# Patient Record
Sex: Female | Born: 1995 | Hispanic: No | Marital: Single | Smoking: Never smoker
Health system: Southern US, Community
[De-identification: ages and names within clinical notes are randomized; demographics above are authoritative.]

## PROBLEM LIST (undated history)

## (undated) ENCOUNTER — Inpatient Hospital Stay (HOSPITAL_COMMUNITY): Payer: Self-pay

## (undated) DIAGNOSIS — Z8619 Personal history of other infectious and parasitic diseases: Secondary | ICD-10-CM

## (undated) DIAGNOSIS — N390 Urinary tract infection, site not specified: Secondary | ICD-10-CM

## (undated) DIAGNOSIS — D649 Anemia, unspecified: Secondary | ICD-10-CM

## (undated) DIAGNOSIS — R7989 Other specified abnormal findings of blood chemistry: Secondary | ICD-10-CM

## (undated) DIAGNOSIS — E039 Hypothyroidism, unspecified: Secondary | ICD-10-CM

## (undated) DIAGNOSIS — O139 Gestational [pregnancy-induced] hypertension without significant proteinuria, unspecified trimester: Secondary | ICD-10-CM

## (undated) DIAGNOSIS — L309 Dermatitis, unspecified: Secondary | ICD-10-CM

## (undated) HISTORY — PX: NO PAST SURGERIES: SHX2092

## (undated) HISTORY — DX: Hypothyroidism, unspecified: E03.9

## (undated) HISTORY — DX: Other specified abnormal findings of blood chemistry: R79.89

## (undated) HISTORY — DX: Personal history of other infectious and parasitic diseases: Z86.19

## (undated) HISTORY — DX: Anemia, unspecified: D64.9

---
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
## 2016-11-04 DIAGNOSIS — R7989 Other specified abnormal findings of blood chemistry: Secondary | ICD-10-CM

## 2016-11-04 HISTORY — DX: Other specified abnormal findings of blood chemistry: R79.89

## 2016-12-11 ENCOUNTER — Encounter: Payer: Self-pay | Admitting: Family Medicine

## 2017-11-04 DIAGNOSIS — Z5189 Encounter for other specified aftercare: Secondary | ICD-10-CM

## 2017-11-04 HISTORY — DX: Encounter for other specified aftercare: Z51.89

## 2018-07-02 DIAGNOSIS — L83 Acanthosis nigricans: Secondary | ICD-10-CM | POA: Diagnosis not present

## 2018-07-02 DIAGNOSIS — L299 Pruritus, unspecified: Secondary | ICD-10-CM | POA: Diagnosis not present

## 2018-09-04 ENCOUNTER — Ambulatory Visit: Payer: BLUE CROSS/BLUE SHIELD | Admitting: Internal Medicine

## 2018-09-04 ENCOUNTER — Encounter: Payer: Self-pay | Admitting: Internal Medicine

## 2018-09-04 VITALS — BP 118/72 | HR 70 | Ht 62.5 in | Wt 172.6 lb

## 2018-09-04 DIAGNOSIS — E039 Hypothyroidism, unspecified: Secondary | ICD-10-CM | POA: Diagnosis not present

## 2018-09-04 LAB — TSH: TSH: 4.95 u[IU]/mL — ABNORMAL HIGH (ref 0.35–4.50)

## 2018-09-04 LAB — T4, FREE: Free T4: 0.74 ng/dL (ref 0.60–1.60)

## 2018-09-04 NOTE — Progress Notes (Signed)
Name: Joann Fleming  MRN/ DOB: [id number], Nov 11, 1995    Age/ Sex: 22 y.o., female    PCP: Barry Brunner   Reason for Endocrinology Evaluation: Hypothyroidism/ elevated prolactin     Date of Initial Endocrinology Evaluation: 09/04/2018     HPI: Joann Fleming is a 22 y.o. female with unremarkable past medical history . The patient presented for initial endocrinology clinic visit on 09/04/2018 for consultative assistance with her Hypothyroidism   In 2018, during work up for severe headaches, she was found to have elevated TSH of 454 uIU/mL . At the time she was euthyroid without any c/o of weight gain, fatigue, constipation or dry skin. Her menses have always been regular.  Denies neck swelling, pain, dysphagia or hoarseness of the voice.  Menarche age 2 yrs.   Denies OTC meds at this time including biotin or any other vitamins.  Mother with DM.     I have reviewed PMH, PSH, SH and FH   HISTORY:  Past Medical History:  Past Medical History:  Diagnosis Date  . Abnormal TSH 2018    Past Surgical History: None   Social History:  reports that she has never smoked. She has never used smokeless tobacco. She reports that she does not use drugs.  Family History: family history includes Diabetes type II in her mother.   HOME MEDICATIONS: No current outpatient medications on file prior to visit.   No current facility-administered medications on file prior to visit.       REVIEW OF SYSTEMS: A comprehensive ROS was conducted with the patient and is negative except as per HPI and below:  Review of Systems  Constitutional: Negative for malaise/fatigue and weight loss.  HENT: Negative.   Eyes: Negative.   Respiratory: Negative.   Cardiovascular: Negative.   Gastrointestinal: Negative.   Genitourinary: Negative.   Musculoskeletal: Negative.   Skin: Positive for rash.       In September , now with post-inflammatory hyperpigmentation on the LLQ of abdomen     Neurological: Negative.   Endo/Heme/Allergies: Negative for polydipsia.  Psychiatric/Behavioral: Negative.        OBJECTIVE:  VS: BP 118/72 (BP Location: Left Arm)   Pulse 70   Ht 5' 2.5" (1.588 m)   Wt 172 lb 9.6 oz (78.3 kg)   LMP 08/07/2018   SpO2 97%   BMI 31.07 kg/m    Wt Readings from Last 3 Encounters:  09/04/18 172 lb 9.6 oz (78.3 kg)     EXAM: General: Pt appears well and is in NAD  Hydration: Well-hydrated with moist mucous membranes and good skin turgor  Eyes: External eye exam normal without stare, lid lag or exophthalmos.  EOM intact.  PERRL.  Ears, Nose, Throat: Hearing: Grossly intact bilaterally Dental: Good dentition  Throat: Clear without mass, erythema or exudate  Neck: General: Supple without adenopathy. Thyroid: Thyroid size normal.  No goiter or nodules appreciated. No thyroid bruit.  Lungs: Clear with good BS bilat with no rales, rhonchi, or wheezes  Heart: Auscultation: RRR.  Abdomen: Normoactive bowel sounds, soft, nontender, without masses or organomegaly palpable  Extremities: Gait and station: Normal gait  Digits and nails: No clubbing, cyanosis, petechiae, or nodes Head and neck: Normal alignment and mobility BL UE: Normal ROM and strength. BL LE: No pretibial edema normal ROM and strength.  Skin: Hair: Texture and amount normal with gender appropriate distribution Skin Inspection: No rashes, she does have post-inflammatory hyperpigmentation ,+ acanthosis nigricans at the axillae  Skin Palpation: Skin temperature, texture, and thickness normal to palpation  Neuro: Cranial nerves: II - XII grossly intact  Motor: Normal strength throughout DTRs: 2+ and symmetric in UE with delay in relaxation phase  Mental Status: Judgment, insight: Intact Orientation: Oriented to time, place, and person Mood and affect: No depression, anxiety, or agitation     DATA REVIEWED: 12/12/16  454.55 uIU/mL LDL 183 mg/dL      Results for SURINA, STORTS (MRN  [id number]) as of 09/04/2018 14:03  Ref. Range 09/04/2018 08:24  TSH Latest Ref Range: 0.35 - 4.50 uIU/mL 4.95 (H)  T4,Free(Direct) Latest Ref Range: 0.60 - 1.60 ng/dL 4.09   ASSESSMENT/PLAN/RECOMMENDATIONS:   1.Subclinical Hypothyroidism :  - Patient is clinically euthyroid.  - She denies any local neck symptoms.  - She has mild elevation of TSH with normal FT4. No need for LT-4 replacement at this time.  - Will recheck on next visit.     2. Hx opf elevated Prolactin:  This is documented in the chart, but I don't have the actual results. Will repeat today.   Pt expressed understanding of the above   F/u in 2 months   Signed electronically by: Lyndle Herrlich, MD  [facility] Endocrinology  [facility] [address] [place] [place], [place] [address] Phone: [phone] FAX: [phone]   CC: Barry Brunner [address] [place], [place] [address] Phone: [phone] Fax: [phone]   Return to Endocrinology clinic as below: Future Appointments  Date Time Provider Department Center  12/07/2018  7:30 AM Lavelle Berland, Konrad Dolores, MD [facility] None

## 2018-09-04 NOTE — Patient Instructions (Signed)
-   Please stop by the lab today - We will contact you with results

## 2018-09-05 LAB — PROLACTIN: Prolactin: 13.4 ng/mL

## 2018-09-07 ENCOUNTER — Encounter: Payer: Self-pay | Admitting: Internal Medicine

## 2018-12-04 NOTE — Progress Notes (Signed)
Name: Joann FieldHeidi Fleming Fleming  MRN/ DOB: [id number], 20-Apr-1996    Age/ Sex: 23 y.o., female     PCP:  Barry BrunnerMalia Lonergan   Reason for Endocrinology Evaluation: Hypothyroidism/ elevated prolactin     Initial Endocrinology Clinic Visit: 09/04/2018    PATIENT IDENTIFIER: Ms. Joann FieldHeidi Fleming Fleming is a 23 y.o., female with unremarkable past medical history . She has followed with [facility] since 09/04/2018 for consultative assistance with management of her Hypothyroidism.   HISTORICAL SUMMARY:  In 2018, during work up for severe headaches, she was found to have elevated TSH of 454 uIU/mL . At the time she was euthyroid without any c/o of weight gain, fatigue, constipation or dry skin. Her menses have always been regular. On repeat TSH here in our office (09/04/2018) her TSH was 4.95 uIU/mL  with normal prolactin level.   SUBJECTIVE:   During last visit (09/05/19): Pt was clinically euthyroid with mildly elevated TSH at 4.95 uIu/mL. No treatment was offered at the time.   Today (12/07/2018):  Joann Fleming is here for a 3 month follow up on her subclinical hypothyroidism. Today she denies any weight gain, fatigue, constipation or depression.     ROS:  As per HPI.   HISTORY:  Past Medical History:  Past Medical History:  Diagnosis Date  . Abnormal TSH 2018    Past Surgical History: Abnormal TSH   Social History:  reports that she has never smoked. She has never used smokeless tobacco. She reports that she does not use drugs.  Family History: family history includes Diabetes type II in her mother.   HOME MEDICATIONS: Allergies as of 12/07/2018   No Known Allergies     Medication List    as of December 07, 2018  8:15 AM   You have not been prescribed any medications.       OBJECTIVE:   PHYSICAL EXAM: VS: BP 102/82 (BP Location: Left Arm, Patient Position: Sitting, Cuff Size: Normal)   Pulse 67   Ht 5\' 3"  (1.6 m)   Wt 170 lb 9.6 oz (77.4 kg)   SpO2 94%   BMI 30.22  kg/m    EXAM: General: Pt appears well and is in NAD  Hydration: Well-hydrated with moist mucous membranes and good skin turgor  Eyes: External eye exam normal without stare, lid lag or exophthalmos.  EOM intact.  PERRL.  Ears, Nose, Throat: Hearing: Grossly intact bilaterally Dental: Good dentition  Throat: Clear without mass, erythema or exudate  Neck: General: Supple without adenopathy. Thyroid: Thyroid size normal.  No goiter or nodules appreciated. No thyroid bruit.  Lungs: Clear with good BS bilat with no rales, rhonchi, or wheezes  Heart: Auscultation: RRR.  Abdomen: Normoactive bowel sounds, soft, nontender, without masses or organomegaly palpable  Mental Status: Judgment, insight: Intact Orientation: Oriented to time, place, and person Mood and affect: No depression, anxiety, or agitation     DATA REVIEWED: Results for Joann FieldMENDEZ, Joann Fleming (MRN [id number]) as of 12/07/2018 08:13  Ref. Range 09/04/2018 08:24  Prolactin Latest Units: ng/mL 13.4  TSH Latest Ref Range: 0.35 - 4.50 uIU/mL 4.95 (H)  T4,Free(Direct) Latest Ref Range: 0.60 - 1.60 ng/dL 9.620.74    Results for Joann FieldMENDEZ, Joann Fleming (MRN [id number]) as of 12/10/2018 12:33  Ref. Range 12/07/2018 08:27  TSH Latest Ref Range: 0.35 - 4.50 uIU/mL 8.94 (H)  Triiodothyronine (T3) Latest Ref Range: 76 - 181 ng/dL 401120  U2,VOZD(GUYQIHT4,Free(Direct) Latest Ref Range: 0.60 - 1.60 ng/dL 4.740.79  Thyroperoxidase Ab SerPl-aCnc  Latest Ref Range: <9 IU/mL 410 (H)    ASSESSMENT / PLAN / RECOMMENDATIONS:   1. Subclinical Hypothyroidism Secondary to Hashimoto's Disease:    - Pt is clinically euthyroid  - Repeat TFT's today show elevated TSH as < 10.0 uIU/ML  - Elevated  Anti-TPO Ab's  - Give her TSH is < 10.0 uIU/mL and she lacks any hypothyroid symptoms, we could hold off on starting LT-4 replacement until her TSH is > 10.0 uIU/mL or she develops hypothyroid symptoms.      F/U in 1 yr   Addendum : I was unable to get hold of her over the phone. A letter  will be mailed.   Signed electronically by: Lyndle Herrlich, MD  [facility] Endocrinology  [facility] [address] [place] [place], [place] [address] Phone: [phone] FAX: [phone]      CC: Barry Brunner [address] [place], [place] [address] Phone: [phone] Fax: [phone]  Return to Endocrinology clinic as below: Future Appointments  Date Time Provider Department Center  12/08/2019  7:30 AM Shamleffer, Konrad Dolores, MD [facility] None

## 2018-12-07 ENCOUNTER — Ambulatory Visit: Payer: BLUE CROSS/BLUE SHIELD | Admitting: Internal Medicine

## 2018-12-07 ENCOUNTER — Encounter: Payer: Self-pay | Admitting: Internal Medicine

## 2018-12-07 VITALS — BP 102/82 | HR 67 | Ht 63.0 in | Wt 170.6 lb

## 2018-12-07 DIAGNOSIS — E063 Autoimmune thyroiditis: Secondary | ICD-10-CM | POA: Diagnosis not present

## 2018-12-07 DIAGNOSIS — E038 Other specified hypothyroidism: Secondary | ICD-10-CM

## 2018-12-07 DIAGNOSIS — E039 Hypothyroidism, unspecified: Secondary | ICD-10-CM | POA: Diagnosis not present

## 2018-12-07 LAB — T4, FREE: Free T4: 0.79 ng/dL (ref 0.60–1.60)

## 2018-12-07 LAB — TSH: TSH: 8.94 u[IU]/mL — ABNORMAL HIGH (ref 0.35–4.50)

## 2018-12-07 NOTE — Patient Instructions (Signed)
-   Based on your previous thyroid test, you have "Subclinical Hypothyroidism" This does not need any intervention at this time., just annual testing or sooner should you have any symptoms of excessive fatigue, unexplained weight gain or depression.

## 2018-12-08 LAB — THYROID PEROXIDASE ANTIBODIES (TPO) (REFL): Thyroperoxidase Ab SerPl-aCnc: 410 IU/mL — ABNORMAL HIGH (ref <9)

## 2018-12-08 LAB — T3: T3, Total: 120 ng/dL (ref 76–181)

## 2018-12-09 ENCOUNTER — Telehealth: Payer: Self-pay | Admitting: Internal Medicine

## 2018-12-09 NOTE — Telephone Encounter (Signed)
Left a message for a call back.

## 2018-12-10 ENCOUNTER — Encounter: Payer: Self-pay | Admitting: Internal Medicine

## 2018-12-10 DIAGNOSIS — E063 Autoimmune thyroiditis: Secondary | ICD-10-CM | POA: Insufficient documentation

## 2018-12-10 DIAGNOSIS — E038 Other specified hypothyroidism: Secondary | ICD-10-CM | POA: Insufficient documentation

## 2018-12-10 DIAGNOSIS — E039 Hypothyroidism, unspecified: Secondary | ICD-10-CM | POA: Insufficient documentation

## 2019-11-25 DIAGNOSIS — R809 Proteinuria, unspecified: Secondary | ICD-10-CM | POA: Diagnosis not present

## 2019-11-25 DIAGNOSIS — Z30011 Encounter for initial prescription of contraceptive pills: Secondary | ICD-10-CM | POA: Diagnosis not present

## 2019-12-02 DIAGNOSIS — R7989 Other specified abnormal findings of blood chemistry: Secondary | ICD-10-CM | POA: Diagnosis not present

## 2019-12-02 DIAGNOSIS — R5383 Other fatigue: Secondary | ICD-10-CM | POA: Diagnosis not present

## 2019-12-02 DIAGNOSIS — D649 Anemia, unspecified: Secondary | ICD-10-CM | POA: Diagnosis not present

## 2019-12-06 DIAGNOSIS — E611 Iron deficiency: Secondary | ICD-10-CM | POA: Diagnosis not present

## 2019-12-07 DIAGNOSIS — E611 Iron deficiency: Secondary | ICD-10-CM | POA: Diagnosis not present

## 2019-12-08 ENCOUNTER — Other Ambulatory Visit: Payer: Self-pay

## 2019-12-08 ENCOUNTER — Encounter: Payer: Self-pay | Admitting: Internal Medicine

## 2019-12-08 ENCOUNTER — Ambulatory Visit: Payer: BC Managed Care – PPO | Admitting: Internal Medicine

## 2019-12-08 VITALS — BP 110/68 | HR 80 | Temp 98.1°F | Ht 63.0 in | Wt 179.4 lb

## 2019-12-08 DIAGNOSIS — R7989 Other specified abnormal findings of blood chemistry: Secondary | ICD-10-CM

## 2019-12-08 DIAGNOSIS — E063 Autoimmune thyroiditis: Secondary | ICD-10-CM | POA: Diagnosis not present

## 2019-12-08 DIAGNOSIS — E039 Hypothyroidism, unspecified: Secondary | ICD-10-CM | POA: Diagnosis not present

## 2019-12-08 DIAGNOSIS — E038 Other specified hypothyroidism: Secondary | ICD-10-CM

## 2019-12-08 DIAGNOSIS — E611 Iron deficiency: Secondary | ICD-10-CM | POA: Diagnosis not present

## 2019-12-08 LAB — TSH: TSH: 18.46 u[IU]/mL — ABNORMAL HIGH (ref 0.35–4.50)

## 2019-12-08 LAB — PROLACTIN: Prolactin: 21.3 ng/mL

## 2019-12-08 LAB — T4, FREE: Free T4: 0.7 ng/dL (ref 0.60–1.60)

## 2019-12-08 NOTE — Progress Notes (Signed)
Name: Joann Fleming  MRN/ DOB: [id number], 1996-06-21    Age/ Sex: 24 y.o., female     PCP:  Barry Brunner   Reason for Endocrinology Evaluation: Hypothyroidism/ elevated prolactin     Initial Endocrinology Clinic Visit: 09/04/2018    PATIENT IDENTIFIER: [name] is a 24 y.o., female with unremarkable past medical history . She has followed with [facility] since 09/04/2018 for consultative assistance with management of her Hypothyroidism.   HISTORICAL SUMMARY:  In 2018, during work up for severe headaches, she was found to have elevated TSH of 454 uIU/mL . At the time she was euthyroid without any c/o of weight gain, fatigue, constipation or dry skin. Her menses have always been regular. On repeat TSH here in our office (09/04/2018) her TSH was 4.95 uIU/mL  with normal prolactin level.   SUBJECTIVE:   During last visit (12/07/2018): Pt was clinically euthyroid with mildly elevated TSH at 8.94 uIu/mL. No treatment was offered at the time.   Today (12/08/2019):  [name] is here for a follow up on her subclinical hypothyroidism.    Today she admits to weight gain   She has noted fatigue, but denies  constipation or depression.   Denies local neck symptoms   mensuration are regular  No nipple discharge     ROS:  As per HPI.   HISTORY:  Past Medical History:  Past Medical History:  Diagnosis Date  . Abnormal TSH 2018    Past Surgical History: Abnormal TSH   Social History:  reports that she has never smoked. She has never used smokeless tobacco. She reports that she does not use drugs.  Family History: family history includes Diabetes type II in her mother.   HOME MEDICATIONS: Allergies as of 12/08/2019   No Known Allergies     Medication List       Accurate as of December 08, 2019  7:49 AM. If you have any questions, ask your nurse or doctor.        ergocalciferol 1.25 MG (50000 UT) capsule Commonly known as: VITAMIN  D2 ergocalciferol (vitamin D2) 1,250 mcg (50,000 unit) capsule  TAKE 1 CAPSULE BY MOUTH EVERY WEEK   FeroSul 325 (65 FE) MG tablet Generic drug: ferrous sulfate Take 325 mg by mouth daily.   Ferrlecit 12.5 MG/ML injection Generic drug: ferric gluconate Ferrlecit 62.5 mg/5 mL intravenous solution  Inject 125 mg every day by intravenous route for 5 days.         OBJECTIVE:   PHYSICAL EXAM: VS: BP 110/68 (BP Location: Left Arm, Patient Position: Sitting, Cuff Size: Large)   Pulse 80   Temp 98.1 F (36.7 C)   Ht 5\' 3"  (1.6 m)   Wt 179 lb 6.4 oz (81.4 kg)   SpO2 98%   BMI 31.78 kg/m    EXAM: General: Pt appears well and is in NAD  Neck: General: Supple without adenopathy. Thyroid: Thyroid size normal.  No goiter or nodules appreciated. No thyroid bruit.  Lungs: Clear with good BS bilat with no rales, rhonchi, or wheezes  Heart: Auscultation: RRR.  Abdomen: Normoactive bowel sounds, soft, nontender, without masses or organomegaly palpable  Mental Status: Judgment, insight: Intact Orientation: Oriented to time, place, and person Mood and affect: No depression, anxiety, or agitation     DATA REVIEWED: Results for NEIDA, ELLEGOOD (MRN Delford Field) as of 12/09/2019 07:29  Ref. Range 09/04/2018 08:24 12/07/2018 08:27 12/08/2019 08:06  TSH Latest Ref Range: 0.35 -  4.50 uIU/mL 4.95 (H) 8.94 (H) 18.46 (H)  Triiodothyronine (T3) Latest Ref Range: 76 - 181 ng/dL  120   T4,Free(Direct) Latest Ref Range: 0.60 - 1.60 ng/dL 0.74 0.79 0.70  Thyroperoxidase Ab SerPl-aCnc Latest Ref Range: <9 IU/mL  410 (H)    Results for ASHAKI, FROSCH (MRN [id number]) as of 12/09/2019 07:29  Ref. Range 12/08/2019 08:06  Prolactin Latest Units: ng/mL 21.3   Results for MONCHEL, POLLITT (MRN [id number]) as of 12/10/2018 12:33  Ref. Range 12/07/2018 08:27  Thyroperoxidase Ab SerPl-aCnc Latest Ref Range: <9 IU/mL 410 (H)    ASSESSMENT / PLAN / RECOMMENDATIONS:   1. Hypothyroidism Secondary to Hashimoto's Disease:     - Pt is clinically and biochemically hypothyroid - No local neck symptoms. -Patient will be started on LT-4 replacement - Pt educated extensively on the correct way to take levothyroxine (first thing in the morning with water, 30 minutes before eating or taking other medications). - Pt encouraged to double dose the following day if she were to miss a dose given long half-life of levothyroxine.  Medication Levothyroxine 50 MCG daily   2. Hx of Elevated prolactin:    - She had a one time abnormal reading pf Prolactin, repeat labs confirmed normal prolactin.  - No clinical evidence of hyperprolactinemia - Will recheck - suspect lab error.   F/U in 6 months    Signed electronically by: Mack Guise, MD  [facility] Endocrinology  [facility] [address] [place], [place] [address] Phone: [phone] FAX: [phone]      CC: Brantley Stage [address] [place], [place] [address] Phone: [phone] Fax: [phone]  Return to Endocrinology clinic as below: No future appointments.

## 2019-12-09 ENCOUNTER — Telehealth: Payer: Self-pay | Admitting: Internal Medicine

## 2019-12-09 DIAGNOSIS — E611 Iron deficiency: Secondary | ICD-10-CM | POA: Diagnosis not present

## 2019-12-09 MED ORDER — LEVOTHYROXINE SODIUM 50 MCG PO TABS: 50.0000 ug | ORAL_TABLET | Freq: Every day | ORAL | 3 refills | Status: DC

## 2019-12-09 NOTE — Telephone Encounter (Signed)
Lft vm to return call to discuss results.  

## 2019-12-09 NOTE — Telephone Encounter (Signed)
Please let her know her thyroid is worse then it was last year, this is why she has been feeling tired and gaining weights.. She needs to start levothyroxine at 50 mcg daily    Please let her know to take it on an empty stomach, 30 minutes before breakfast, if she forgets to take it 1 day she could double up the next day.  Please schedule her for a lab appointment in 8 weeks    Thanks.   Joann Raelyn Mora, MD  [facility] Endocrinology  [facility] [address] [place], [place] [address] Phone: [phone] FAX: [phone]

## 2019-12-10 DIAGNOSIS — E611 Iron deficiency: Secondary | ICD-10-CM | POA: Diagnosis not present

## 2019-12-10 NOTE — Telephone Encounter (Signed)
Lft 2nd vm to return call 

## 2019-12-13 NOTE — Telephone Encounter (Signed)
Letter was mailed 12/11/2019

## 2020-01-07 DIAGNOSIS — D509 Iron deficiency anemia, unspecified: Secondary | ICD-10-CM | POA: Diagnosis not present

## 2020-05-16 DIAGNOSIS — R309 Painful micturition, unspecified: Secondary | ICD-10-CM | POA: Diagnosis not present

## 2020-05-16 DIAGNOSIS — Z124 Encounter for screening for malignant neoplasm of cervix: Secondary | ICD-10-CM | POA: Diagnosis not present

## 2020-05-16 DIAGNOSIS — N899 Noninflammatory disorder of vagina, unspecified: Secondary | ICD-10-CM | POA: Diagnosis not present

## 2020-05-16 DIAGNOSIS — Z283 Underimmunization status: Secondary | ICD-10-CM | POA: Diagnosis not present

## 2020-05-16 DIAGNOSIS — E785 Hyperlipidemia, unspecified: Secondary | ICD-10-CM | POA: Diagnosis not present

## 2020-05-16 DIAGNOSIS — Z0000 Encounter for general adult medical examination without abnormal findings: Secondary | ICD-10-CM | POA: Diagnosis not present

## 2020-05-16 DIAGNOSIS — E039 Hypothyroidism, unspecified: Secondary | ICD-10-CM | POA: Diagnosis not present

## 2020-05-16 DIAGNOSIS — Z0001 Encounter for general adult medical examination with abnormal findings: Secondary | ICD-10-CM | POA: Diagnosis not present

## 2020-05-16 DIAGNOSIS — E559 Vitamin D deficiency, unspecified: Secondary | ICD-10-CM | POA: Diagnosis not present

## 2020-05-16 DIAGNOSIS — R7989 Other specified abnormal findings of blood chemistry: Secondary | ICD-10-CM | POA: Diagnosis not present

## 2020-06-24 ENCOUNTER — Emergency Department (HOSPITAL_COMMUNITY): Payer: BC Managed Care – PPO

## 2020-06-24 ENCOUNTER — Other Ambulatory Visit: Payer: Self-pay

## 2020-06-24 ENCOUNTER — Emergency Department (HOSPITAL_COMMUNITY)
Admission: EM | Admit: 2020-06-24 | Discharge: 2020-06-24 | Disposition: A | Discharge status: Home / Self Care | Payer: BC Managed Care – PPO | Attending: Emergency Medicine | Admitting: Emergency Medicine

## 2020-06-24 DIAGNOSIS — M25559 Pain in unspecified hip: Secondary | ICD-10-CM | POA: Diagnosis not present

## 2020-06-24 DIAGNOSIS — Z5321 Procedure and treatment not carried out due to patient leaving prior to being seen by health care provider: Secondary | ICD-10-CM | POA: Insufficient documentation

## 2020-06-24 DIAGNOSIS — R52 Pain, unspecified: Secondary | ICD-10-CM | POA: Diagnosis not present

## 2020-06-24 DIAGNOSIS — Y939 Activity, unspecified: Secondary | ICD-10-CM | POA: Insufficient documentation

## 2020-06-24 DIAGNOSIS — R519 Headache, unspecified: Secondary | ICD-10-CM | POA: Diagnosis not present

## 2020-06-24 DIAGNOSIS — Y929 Unspecified place or not applicable: Secondary | ICD-10-CM | POA: Diagnosis not present

## 2020-06-24 DIAGNOSIS — M25519 Pain in unspecified shoulder: Secondary | ICD-10-CM | POA: Diagnosis not present

## 2020-06-24 DIAGNOSIS — Y999 Unspecified external cause status: Secondary | ICD-10-CM | POA: Diagnosis not present

## 2020-06-24 DIAGNOSIS — M25552 Pain in left hip: Secondary | ICD-10-CM | POA: Diagnosis not present

## 2020-06-24 DIAGNOSIS — M25511 Pain in right shoulder: Secondary | ICD-10-CM | POA: Insufficient documentation

## 2020-06-24 DIAGNOSIS — M25512 Pain in left shoulder: Secondary | ICD-10-CM | POA: Diagnosis not present
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
## 2020-06-24 NOTE — ED Notes (Signed)
Patient stated she was leaving, no longer wanted to be seen at this time.

## 2020-06-24 NOTE — ED Notes (Signed)
Called pt x2 for vitals, no response. °

## 2020-12-05 ENCOUNTER — Other Ambulatory Visit: Payer: Self-pay

## 2020-12-07 ENCOUNTER — Encounter: Payer: Self-pay | Admitting: Internal Medicine

## 2020-12-07 ENCOUNTER — Other Ambulatory Visit: Payer: Self-pay

## 2020-12-07 ENCOUNTER — Ambulatory Visit (INDEPENDENT_AMBULATORY_CARE_PROVIDER_SITE_OTHER): Payer: BC Managed Care – PPO | Admitting: Internal Medicine

## 2020-12-07 VITALS — BP 116/72 | HR 71 | Ht 63.0 in | Wt 175.2 lb

## 2020-12-07 DIAGNOSIS — E063 Autoimmune thyroiditis: Secondary | ICD-10-CM

## 2020-12-07 LAB — TSH: TSH: 3.98 u[IU]/mL (ref 0.35–4.50)

## 2020-12-07 NOTE — Progress Notes (Signed)
Name: Joann Fleming  MRN/ DOB: [id number], December 21, 1995    Age/ Sex: 25 y.o., female     PCP:  Barry Brunner   Reason for Endocrinology Evaluation: Hypothyroidism/ elevated prolactin     Initial Endocrinology Clinic Visit: 09/04/2018    PATIENT IDENTIFIER: [name] is a 25 y.o., female with unremarkable past medical history . She has followed with [facility] since 09/04/2018 for consultative assistance with management of her Hypothyroidism.   HISTORICAL SUMMARY:  In 2018, during work up for severe headaches, she was found to have elevated TSH of 454 uIU/mL . At the time she was euthyroid without any c/o of weight gain, fatigue, constipation or dry skin. Her menses have always been regular. On repeat TSH here in our office (09/04/2018) her TSH was 4.95 uIU/mL  with normal prolactin level.   Her TSH increased to 18.46 uIU/mL in 12/2019 , we attempted to start LT-4 but did not get the message , repeat labs in 12/2020 TSH went down to 3.98 uIU/mL   SUBJECTIVE:    Today (12/07/2020):  Ms. [name] is here for a follow up on her hypothyroidism.   Unfortunately she never started her Levothyroxine.   Weight continues to fluctuate   Denies fatigue, depression  Has constipation recently   Denies local neck symptoms   LMP 11/08/2020  No nipple discharge       HISTORY:  Past Medical History:  Past Medical History:  Diagnosis Date  . Abnormal TSH 2018    Past Surgical History: Abnormal TSH   Social History:  reports that she has never smoked. She has never used smokeless tobacco. She reports that she does not use drugs.  Family History: family history includes Diabetes type II in her mother.   HOME MEDICATIONS: Allergies as of 12/07/2020   No Known Allergies     Medication List       Accurate as of December 07, 2020  7:49 AM. If you have any questions, ask your nurse or doctor.        STOP taking these medications   ergocalciferol 1.25 MG (50000  UT) capsule Commonly known as: VITAMIN D2 Stopped by: Scarlette Shorts, MD   FeroSul 325 (65 FE) MG tablet Generic drug: ferrous sulfate Stopped by: Scarlette Shorts, MD   ferric gluconate 12.5 MG/ML injection Commonly known as: NULECIT Stopped by: Scarlette Shorts, MD     TAKE these medications   levothyroxine 50 MCG tablet Commonly known as: SYNTHROID Take 1 tablet (50 mcg total) by mouth daily.         OBJECTIVE:   PHYSICAL EXAM: VS: BP 116/72   Pulse 71   Ht 5\' 3"  (1.6 m)   Wt 175 lb 4 oz (79.5 kg)   LMP 11/08/2020   SpO2 98%   BMI 31.04 kg/m    EXAM: General: Pt appears well and is in NAD  Neck: General: Supple without adenopathy. Thyroid: Thyroid size normal.  No goiter or nodules appreciated. No thyroid bruit.  Lungs: Clear with good BS bilat with no rales, rhonchi, or wheezes  Heart: Auscultation: RRR.  Abdomen: Normoactive bowel sounds, soft, nontender, without masses or organomegaly palpable  Mental Status: Judgment, insight: Intact Orientation: Oriented to time, place, and person Mood and affect: No depression, anxiety, or agitation     DATA REVIEWED: Results for ARRIANNA, CATALA (MRN Delford Field) as of 12/07/2020 12:40  Ref. Range 12/07/2020 07:57  TSH Latest Ref Range: 0.35 - 4.50  uIU/mL 3.98   Results for FAUSTINA, GEBERT (MRN [id number]) as of 12/09/2019 07:29  Ref. Range 12/08/2019 08:06  Prolactin Latest Units: ng/mL 21.3   Results for TYMEKA, PRIVETTE (MRN [id number]) as of 12/10/2018 12:33  Ref. Range 12/07/2018 08:27  Thyroperoxidase Ab SerPl-aCnc Latest Ref Range: <9 IU/mL 410 (H)    ASSESSMENT / PLAN / RECOMMENDATIONS:   1.  Hashimoto's Thyroiditis :   - Pt is clinically euthyroid  - No local neck symptoms. - Repeat TSH has normalized, no need to start LT-4 replacement , this is part of hashimoto's thyroiditis    2. Hx of Elevated prolactin:    - She had a one time abnormal reading pf Prolactin, repeat labs confirmed normal  prolactin x2.  - No clinical evidence of hyperprolactinemia   F/U in 4 months  Labs in 8 weeks   Signed electronically by: Lyndle Herrlich, MD  [facility] Endocrinology  [facility] [address] [place] [place], [place] [address] Phone: [phone] FAX: [phone]      CC: Barry Brunner [address] [place], [place] [address] Phone: [phone] Fax: [phone]  Return to Endocrinology clinic as below: No future appointments.

## 2020-12-07 NOTE — Patient Instructions (Signed)

## 2021-02-07 ENCOUNTER — Other Ambulatory Visit (INDEPENDENT_AMBULATORY_CARE_PROVIDER_SITE_OTHER): Payer: BC Managed Care – PPO

## 2021-02-07 ENCOUNTER — Other Ambulatory Visit: Payer: Self-pay

## 2021-02-07 DIAGNOSIS — E063 Autoimmune thyroiditis: Secondary | ICD-10-CM | POA: Diagnosis not present

## 2021-02-07 LAB — T4, FREE: Free T4: 0.73 ng/dL (ref 0.60–1.60)

## 2021-02-07 LAB — TSH: TSH: 5.2 u[IU]/mL — ABNORMAL HIGH (ref 0.35–4.50)

## 2021-04-06 ENCOUNTER — Encounter: Payer: Self-pay | Admitting: Internal Medicine

## 2021-04-06 ENCOUNTER — Other Ambulatory Visit: Payer: Self-pay

## 2021-04-06 ENCOUNTER — Ambulatory Visit (INDEPENDENT_AMBULATORY_CARE_PROVIDER_SITE_OTHER): Payer: BC Managed Care – PPO | Admitting: Internal Medicine

## 2021-04-06 VITALS — BP 120/76 | HR 60 | Ht 63.0 in | Wt 186.0 lb

## 2021-04-06 DIAGNOSIS — E063 Autoimmune thyroiditis: Secondary | ICD-10-CM | POA: Diagnosis not present

## 2021-04-06 LAB — TSH: TSH: 21.23 u[IU]/mL — ABNORMAL HIGH (ref 0.35–4.50)

## 2021-04-06 LAB — T4, FREE: Free T4: 0.67 ng/dL (ref 0.60–1.60)

## 2021-04-06 MED ORDER — LEVOTHYROXINE SODIUM 50 MCG PO TABS: 50.0000 ug | ORAL_TABLET | Freq: Every day | ORAL | 3 refills | Status: DC

## 2021-04-06 NOTE — Progress Notes (Signed)
Name: Joann Fleming  MRN/ DOB: [id number], May 04, 1996    Age/ Sex: 25 y.o., female     PCP:  Barry Brunner   Reason for Endocrinology Evaluation: Hypothyroidism/ elevated prolactin     Initial Endocrinology Clinic Visit: 09/04/2018    PATIENT IDENTIFIER: Joann Fleming is a 25 y.o., female with unremarkable past medical history . She has followed with [facility] since 09/04/2018 for consultative assistance with management of her Hypothyroidism.   HISTORICAL SUMMARY:  In 2018, during work up for severe headaches, she was found to have elevated TSH of 454 uIU/mL . At the time she was euthyroid without any c/o of weight gain, fatigue, constipation or dry skin. Her menses have always been regular. On repeat TSH here in our office (09/04/2018) her TSH was 4.95 uIU/mL  with normal prolactin level.   Her TSH increased to 18.46 uIU/mL in 12/2019 , we attempted to start LT-4 but did not get the message , repeat labs in 12/2020 TSH went down to 3.98 uIU/mL and we opted to hold off on any LT-4 replacement  SUBJECTIVE:    Today (04/06/2021):  Joann Fleming is here for a follow up on Hashimoto's disease.    Weight has increased , attributes this to poor eating habits  Denies constipation  Denies  depression  Denies local neck symptoms   LMP 03/12/2021- regular        HISTORY:  Past Medical History:  Past Medical History:  Diagnosis Date  . Abnormal TSH 2018    Past Surgical History: Abnormal TSH   Social History:  reports that she has never smoked. She has never used smokeless tobacco. She reports that she does not use drugs.  Family History: family history includes Diabetes type II in her mother.   HOME MEDICATIONS: Allergies as of 04/06/2021   No Known Allergies     Medication List       Accurate as of April 06, 2021  8:13 AM. If you have any questions, ask your nurse or doctor.        STOP taking these medications   levothyroxine 50 MCG  tablet Commonly known as: SYNTHROID Stopped by: Scarlette Shorts, MD         OBJECTIVE:   PHYSICAL EXAM: VS: BP 120/76   Pulse 60   Ht 5\' 3"  (1.6 m)   Wt 186 lb (84.4 kg)   SpO2 97%   BMI 32.95 kg/m    EXAM: General: Pt appears well and is in NAD  Neck: General: Supple without adenopathy. Thyroid: Thyroid size normal.  No goiter or nodules appreciated. No thyroid bruit.  Lungs: Clear with good BS bilat with no rales, rhonchi, or wheezes  Heart: Auscultation: RRR.  Abdomen: Normoactive bowel sounds, soft, nontender, without masses or organomegaly palpable  Mental Status: Judgment, insight: Intact Orientation: Oriented to time, place, and person Mood and affect: No depression, anxiety, or agitation     DATA REVIEWED:  Results for Joann Fleming, Joann Fleming (MRN Delford Field) as of 04/06/2021 17:18  Ref. Range 12/08/2019 08:06 12/07/2020 07:57 02/07/2021 08:08 04/06/2021 08:20  TSH Latest Ref Range: 0.35 - 4.50 uIU/mL 18.46 (H) 3.98 5.20 (H) 21.23 (H)  T4,Free(Direct) Latest Ref Range: 0.60 - 1.60 ng/dL 06/06/2021  4.31 5.40     Results for Joann Fleming, Joann Fleming (MRN Delford Field) as of 12/10/2018 12:33  Ref. Range 12/07/2018 08:27  Thyroperoxidase Ab SerPl-aCnc Latest Ref Range: <9 IU/mL 410 (H)    ASSESSMENT / PLAN / RECOMMENDATIONS:  1.  Hashimoto's Thyroiditis :   - Pt is clinically euthyroid  - No local neck symptoms. - TSH continues to fluctuate  - I explained to the patient that Hashimoto's Disease is an autoimmune - mediated destruction of the thyroid gland. The usual course of Hashimoto's thyroiditis is the gradual loss of thyroid function. Overt hypothyroidism occurs at a rate of ~ 5% per year.  -TSH elevated at 21.22 u IU/mL   Medication  Start levothyroxine 50 MCG daily  2. Hx of Elevated prolactin:    - She had a one time abnormal reading pf Prolactin, repeat labs confirmed normal prolactin x2.  - No clinical evidence of hyperprolactinemia   F/U in 1 yr  Addendum: Attempted  to call the patient on 04/06/2021 at 1715 the call went directly to voicemail I left the patient a message that she must start levothyroxine due to elevated TSH and I have also asked her to log into the portal.  Signed electronically by: Lyndle Herrlich, MD  [facility] Endocrinology  [facility] [address] [place] [place], [place] [address] Phone: [phone] FAX: [phone]      CC: Barry Brunner [address] [place], [place] [address] Phone: [phone] Fax: [phone]  Return to Endocrinology clinic as below: No future appointments.

## 2021-04-28 DIAGNOSIS — L03119 Cellulitis of unspecified part of limb: Secondary | ICD-10-CM | POA: Diagnosis not present

## 2021-04-28 DIAGNOSIS — L237 Allergic contact dermatitis due to plants, except food: Secondary | ICD-10-CM | POA: Diagnosis not present

## 2021-10-11 DIAGNOSIS — R309 Painful micturition, unspecified: Secondary | ICD-10-CM | POA: Diagnosis not present

## 2021-10-11 DIAGNOSIS — N390 Urinary tract infection, site not specified: Secondary | ICD-10-CM | POA: Diagnosis not present
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
## 2022-04-09 NOTE — Progress Notes (Signed)
Name: Joann Fleming  MRN/ DOB: [id number], 1996/05/13    Age/ Sex: 26 y.o., female     PCP:  Barry Brunner   Reason for Endocrinology Evaluation: Hypothyroidism/ elevated prolactin     Initial Endocrinology Clinic Visit: 09/04/2018    PATIENT IDENTIFIER: Joann Fleming is a 26 y.o., female with unremarkable past medical history . She has followed with [facility] since 09/04/2018 for consultative assistance with management of her Hypothyroidism.   HISTORICAL SUMMARY:   In 2018, during work up for severe headaches, she was found to have elevated TSH of 454 uIU/mL . At the time she was euthyroid without any c/o of weight gain, fatigue, constipation or dry skin. Her menses have always been regular. On repeat TSH here in our office (09/04/2018) her TSH was 4.95 uIU/mL  with normal prolactin level.   Her TSH increased to 18.46 uIU/mL in 12/2019 , we attempted to start LT-4 but did not get the message , repeat labs in 12/2020 TSH went down to 3.98 uIU/mL and we opted to hold off on any LT-4 replacement but this started 04/2021 with a TSH 21.22 uIU/mL    SUBJECTIVE:    Today (04/11/2022):  Joann Fleming is here for a follow up on Hashimoto's disease.   Weight has increased  Denies constipation  Denies  depression  Has hair loss  Denies local neck symptoms   LMP 03/2022- regular   Has not taken levothyroxine in 2 weeks because she left for [place] and forgot to take   Levothyroxine 50 mcg daily    HISTORY:  Past Medical History:  Past Medical History:  Diagnosis Date   Abnormal TSH 2018   Past Surgical History: Abnormal TSH  Social History:  reports that she has never smoked. She has never used smokeless tobacco. She reports that she does not use drugs. Family History: family history includes Diabetes type II in her mother.   HOME MEDICATIONS: Allergies as of 04/11/2022   No Known Allergies      Medication List        Accurate as of April 11, 2022  8:04  AM. If you have any questions, ask your nurse or doctor.          levothyroxine 50 MCG tablet Commonly known as: SYNTHROID Take 1 tablet (50 mcg total) by mouth daily.          OBJECTIVE:   PHYSICAL EXAM: VS: BP 122/80 (BP Location: Left Arm, Patient Position: Sitting, Cuff Size: Large)   Pulse 68   Ht 5\' 3"  (1.6 m)   Wt 197 lb (89.4 kg)   SpO2 99%   BMI 34.90 kg/m    EXAM: General: Pt appears well and is in NAD  Neck: General: Supple without adenopathy. Thyroid: Thyroid size normal.  No goiter or nodules appreciated. No thyroid bruit.  Lungs: Clear with good BS bilat with no rales, rhonchi, or wheezes  Heart: Auscultation: RRR.  Abdomen: Normoactive bowel sounds, soft, nontender, without masses or organomegaly palpable  Mental Status: Judgment, insight: Intact Orientation: Oriented to time, place, and person Mood and affect: No depression, anxiety, or agitation     DATA REVIEWED:   Latest Reference Range & Units 04/11/22 08:19  TSH 0.35 - 5.50 uIU/mL 9.51 (H)  T4,Free(Direct) 0.60 - 1.60 ng/dL 06/11/22     Results for Joann, Fleming (MRN Delford Field) as of 12/10/2018 12:33  Ref. Range 12/07/2018 08:27  Thyroperoxidase Ab SerPl-aCnc Latest Ref Range: <9 IU/mL 410 (  H)    ASSESSMENT / PLAN / RECOMMENDATIONS:   1.  Hashimoto's Thyroiditis :   - Pt is clinically hypothyroid with hair loss and weight gain  - No local neck symptoms. - She has been out of levothyroxine for ~ 2 weeks. I have encouraged compliance, discussed risk of myxedema coma with uncontrolled hypothyroidism.  - Counseled about increaseing LT-4 replacement by 20% with positive pregnancy test  - Pt advised to AVOID pregnancy while thyroid uncontrolled    Medication  Stop levothyroxine 50 MCG daily Start levothyroxine 88 mcg daily     F/U in 4 months  Labs in 8 weeks    Signed electronically by: Lyndle Herrlich, MD  [facility] Endocrinology  [facility] [address] [place] [place], [place] [address] Phone: [phone] FAX: [phone]      CC: Barry Brunner [address] [place], [place] [address] Phone: [phone] Fax: [phone]  Return to Endocrinology clinic as below: No future appointments.

## 2022-04-11 ENCOUNTER — Ambulatory Visit (INDEPENDENT_AMBULATORY_CARE_PROVIDER_SITE_OTHER): Payer: BC Managed Care – PPO | Admitting: Internal Medicine

## 2022-04-11 ENCOUNTER — Encounter: Payer: Self-pay | Admitting: Internal Medicine

## 2022-04-11 VITALS — BP 122/80 | HR 68 | Ht 63.0 in | Wt 197.0 lb

## 2022-04-11 DIAGNOSIS — E063 Autoimmune thyroiditis: Secondary | ICD-10-CM | POA: Diagnosis not present

## 2022-04-11 LAB — T4, FREE: Free T4: 0.87 ng/dL (ref 0.60–1.60)

## 2022-04-11 LAB — TSH: TSH: 9.51 u[IU]/mL — ABNORMAL HIGH (ref 0.35–5.50)

## 2022-04-11 MED ORDER — LEVOTHYROXINE SODIUM 88 MCG PO TABS: 88.0000 ug | ORAL_TABLET | Freq: Every day | ORAL | 3 refills | Status: DC

## 2022-04-11 NOTE — Patient Instructions (Signed)

## 2022-08-12 ENCOUNTER — Ambulatory Visit: Payer: BC Managed Care – PPO | Admitting: Internal Medicine

## 2022-11-04 NOTE — L&D Delivery Note (Signed)
OB/GYN Faculty Practice Delivery Note  Joann Fleming is a 27 y.o. G1P1001 s/p VD at [redacted]w[redacted]d. She was admitted for IOL gHTN.   ROM: 6h 40m with clear fluid GBS Status:  Positive/-- (05/14 0445) Maximum Maternal Temperature: 99.40F  Labor Progress: Initial SVE: 3.5/50/-3. She then progressed to complete.   Delivery Date/Time: 03/22/23 0013 Delivery: Called to room and patient was complete and pushing. Head delivered direct OA. No nuchal cord present. Shoulder and body delivered in usual fashion. Infant with spontaneous cry, placed on mother's abdomen, dried and stimulated. Cord clamped x 2 after 1-minute delay, and cut by FOB. Cord blood drawn. Placenta delivered spontaneously with gentle cord traction. Fundus firm with massage and Pitocin. Labia, perineum, vagina, and cervix inspected with small sulcal laceration, repaired with 3-0 monocryl.  Baby Weight: pending  Placenta: 3 vessel, intact. Sent to L&D Complications: None Lacerations: as above EBL: 138 mL Analgesia: Epidural   Infant:  APGAR (1 MIN): 8   APGAR (5 MINS): 9    Myrtie Hawk, DO OB Family Medicine Fellow, [facility] for [employer], [facility] 03/22/2023, 12:30 AM

## 2022-12-19 ENCOUNTER — Encounter: Payer: Self-pay | Admitting: Internal Medicine

## 2022-12-19 ENCOUNTER — Ambulatory Visit (INDEPENDENT_AMBULATORY_CARE_PROVIDER_SITE_OTHER): Payer: Self-pay | Admitting: Internal Medicine

## 2022-12-19 VITALS — BP 120/80 | HR 63 | Ht 63.0 in | Wt 192.0 lb

## 2022-12-19 DIAGNOSIS — N911 Secondary amenorrhea: Secondary | ICD-10-CM

## 2022-12-19 DIAGNOSIS — Z3490 Encounter for supervision of normal pregnancy, unspecified, unspecified trimester: Secondary | ICD-10-CM

## 2022-12-19 DIAGNOSIS — E063 Autoimmune thyroiditis: Secondary | ICD-10-CM

## 2022-12-19 LAB — LUTEINIZING HORMONE: LH: 0.37 m[IU]/mL

## 2022-12-19 LAB — TSH: TSH: 2.79 u[IU]/mL (ref 0.35–5.50)

## 2022-12-19 LAB — FOLLICLE STIMULATING HORMONE: FSH: 0.8 m[IU]/mL

## 2022-12-19 NOTE — Progress Notes (Signed)
Name: Joann Fleming  MRN/ DOB: [id number], [birth year]    Age/ Sex: 27 y.o., female     PCP:  Brantley Stage   Reason for Endocrinology Evaluation: Hypothyroidism     Initial Endocrinology Clinic Visit: 09/04/2018    PATIENT IDENTIFIER: Joann Fleming is a 27 y.o., female with unremarkable past medical history . She has followed with [facility] since 09/04/2018 for consultative assistance with management of her Hypothyroidism.   HISTORICAL SUMMARY:   In 2018, during work up for severe headaches, she was found to have elevated TSH of 454 uIU/mL . At the time she was euthyroid without any c/o of weight gain, fatigue, constipation or dry skin. Her menses have always been regular. On repeat TSH here in our office (09/04/2018) her TSH was 4.95 uIU/mL  with normal prolactin level.   Her TSH increased to 18.46 uIU/mL in 12/2019 , we attempted to start LT-4 but did not get the message , repeat labs in 12/2020 TSH went down to 3.98 uIU/mL and we opted to hold off on any LT-4 replacement but this started 04/2021 with a TSH 21.22 uIU/mL    SUBJECTIVE:    Today (12/19/2022):  Joann Fleming is here for a follow up on Hashimoto's disease.  Weight continues to fluctuate  Denies constipation  Denies nausea Denies local neck symptoms  Denies palpitations    LMP 07/2022 She is sexually active, uses condom Not interested in conception Last pelvic exam ~ 2 yrs ago  She has noted nipple discharge later December,2023 bilaterally   Levothyroxine 88 mcg daily    HISTORY:  Past Medical History:  Past Medical History:  Diagnosis Date   Abnormal TSH 2018   Past Surgical History: Abnormal TSH  Social History:  reports that she has never smoked. She has never used smokeless tobacco. She reports that she does not use drugs. Family History: family history includes Diabetes type II in her mother.   HOME MEDICATIONS: Allergies as of 12/19/2022   No Known Allergies       Medication List        Accurate as of December 19, 2022  7:04 AM. If you have any questions, ask your nurse or doctor.          levothyroxine 88 MCG tablet Commonly known as: SYNTHROID Take 1 tablet (88 mcg total) by mouth daily.          OBJECTIVE:   PHYSICAL EXAM: VS: BP 120/80 (BP Location: Left Arm, Patient Position: Sitting, Cuff Size: Large)   Pulse 63   Ht 5' 3"$  (1.6 m)   Wt 192 lb (87.1 kg)   SpO2 98%   BMI 34.01 kg/m    EXAM: General: Pt appears well and is in NAD  Neck: General: Supple without adenopathy. Thyroid: Thyroid size normal.  No goiter or nodules appreciated. No thyroid bruit.  Lungs: Clear with good BS bilat with no rales, rhonchi, or wheezes  Heart: Auscultation: RRR.  Abdomen: Normoactive bowel sounds, soft, nontender, without masses or organomegaly palpable  Mental Status: Judgment, insight: Intact Orientation: Oriented to time, place, and person Mood and affect: No depression, anxiety, or agitation     DATA REVIEWED:  Latest Reference Range & Units 12/19/22 08:19  LH mIU/mL 0.37  FSH mIU/ML 0.8  Prolactin ng/mL 197.7 (H)  Estradiol pg/mL 8,984 (H)  Preg, Serum  POSITIVE !  TSH 0.35 - 5.50 uIU/mL 2.79     Results for LOZA, BONDER (MRN [id number]) as  of 12/10/2018 12:33  Ref. Range 12/07/2018 08:27  Thyroperoxidase Ab SerPl-aCnc Latest Ref Range: <9 IU/mL 410 (H)    ASSESSMENT / PLAN / RECOMMENDATIONS:   1.  Hashimoto's Thyroiditis :   - Pt is clinically euthyroid - No local neck symptoms. -TSH is normal, but since she is pregnant, will increase levothyroxine due to increased levothyroxine requirement -Repeat labs in 6 weeks  Medication STOP  levothyroxine 88 mcg daily Start Levothyroxine 112 mcg daily    2.  Secondary amenorrhea:  -She has had history of hyperprolactinemia that resolved spontaneously without medication -Patient with secondary amenorrhea as well as galactorrhea for the past 2 months -Patient tested  positive for pregnancy, hence the elevated prolactin   3.  Pregnancy: -An urgent referral to OB has been placed  F/U in 6 months     Signed electronically by: Joann Guise, MD  [facility] Endocrinology  [facility] [address] [place], [place] [address] Phone: [phone] FAX: [phone]      CC: Brantley Stage [address] [place], [place] [address] Phone: [phone] Fax: [phone]  Return to Endocrinology clinic as below: Future Appointments  Date Time Provider [place]  12/19/2022  7:50 AM Joann Fleming, Joann Crazier, MD [facility] None

## 2022-12-20 ENCOUNTER — Telehealth: Payer: Self-pay | Admitting: Internal Medicine

## 2022-12-20 MED ORDER — LEVOTHYROXINE SODIUM 112 MCG PO TABS: 112.0000 ug | ORAL_TABLET | Freq: Every day | ORAL | 6 refills | Status: DC

## 2022-12-20 NOTE — Telephone Encounter (Signed)
Please schedule the patient for a lab appointment in 6 weeks   Thanks

## 2022-12-20 NOTE — Telephone Encounter (Signed)
Message left on patient's cell phone voice mail to call office to schedule 6 week lab appointment.

## 2022-12-23 LAB — PROLACTIN: Prolactin: 197.7 ng/mL — ABNORMAL HIGH

## 2022-12-23 LAB — TESTOSTERONE, TOTAL, LC/MS/MS: Testosterone, Total, LC-MS-MS: 38 ng/dL (ref 2–45)

## 2022-12-23 LAB — HCG, SERUM, QUALITATIVE: Preg, Serum: POSITIVE — AB

## 2022-12-23 LAB — ESTRADIOL: Estradiol: 8984 pg/mL — ABNORMAL HIGH

## 2023-01-03 ENCOUNTER — Ambulatory Visit (INDEPENDENT_AMBULATORY_CARE_PROVIDER_SITE_OTHER): Payer: Self-pay | Admitting: *Deleted

## 2023-01-03 ENCOUNTER — Ambulatory Visit (INDEPENDENT_AMBULATORY_CARE_PROVIDER_SITE_OTHER): Payer: Self-pay | Admitting: Medical

## 2023-01-03 ENCOUNTER — Encounter (HOSPITAL_BASED_OUTPATIENT_CLINIC_OR_DEPARTMENT_OTHER): Payer: Self-pay | Admitting: Medical

## 2023-01-03 ENCOUNTER — Other Ambulatory Visit (HOSPITAL_COMMUNITY)
Admission: RE | Admit: 2023-01-03 | Discharge: 2023-01-03 | Disposition: A | Discharge status: Home / Self Care | Payer: Medicaid Other | Source: Ambulatory Visit | Attending: Medical | Admitting: Medical

## 2023-01-03 VITALS — BP 129/80 | HR 64 | Wt 194.4 lb

## 2023-01-03 DIAGNOSIS — Z3402 Encounter for supervision of normal first pregnancy, second trimester: Secondary | ICD-10-CM

## 2023-01-03 DIAGNOSIS — O0930 Supervision of pregnancy with insufficient antenatal care, unspecified trimester: Secondary | ICD-10-CM | POA: Insufficient documentation

## 2023-01-03 DIAGNOSIS — Z3403 Encounter for supervision of normal first pregnancy, third trimester: Secondary | ICD-10-CM | POA: Insufficient documentation

## 2023-01-03 DIAGNOSIS — Z6834 Body mass index (BMI) 34.0-34.9, adult: Secondary | ICD-10-CM

## 2023-01-03 DIAGNOSIS — Z3482 Encounter for supervision of other normal pregnancy, second trimester: Secondary | ICD-10-CM

## 2023-01-03 LAB — HEPATITIS C ANTIBODY: HCV Ab: NEGATIVE

## 2023-01-03 MED ORDER — GOODSENSE PRENATAL VITAMINS 28-0.8 MG PO TABS: 1.0000 | ORAL_TABLET | Freq: Every day | ORAL | 11 refills | Status: DC

## 2023-01-03 NOTE — Progress Notes (Signed)
   PRENATAL VISIT NOTE  Subjective:  Joann Fleming is a 27 y.o. G1P0 at 70w0dbeing seen today for her first prenatal visit for this pregnancy.  She is currently monitored for the following issues for this high-risk pregnancy and has Subclinical hypothyroidism; Hashimoto's disease; Elevated prolactin level; Encounter for supervision of normal first pregnancy in second trimester; and Late prenatal care on their problem list.  Patient reports no complaints.  Contractions: Not present. Vag. Bleeding: None.  Movement: Present. Denies leaking of fluid.   She is planning to breastfeed.    The following portions of the patient's history were reviewed and updated as appropriate: allergies, current medications, past family history, past medical history, past social history, past surgical history and problem list.   Objective:   Vitals:   01/03/23 1118  BP: 129/80  Pulse: 64  Weight: 194 lb 6.4 oz (88.2 kg)    Fetal Status: Fetal Heart Rate (bpm): 134   Movement: Present     General:  Alert, oriented and cooperative. Patient is in no acute distress.  Skin: Skin is warm and dry. No rash noted.   Cardiovascular: Normal heart rate noted  Respiratory: Normal respiratory effort.   Abdomen: Soft, gravid, appropriate for gestational age. Non-tender. Pain/Pressure: Absent     Pelvic: Cervical exam deferred        Extremities: Normal range of motion.  Edema: None  Mental Status: Normal mood and affect. Normal behavior. Normal judgment and thought content.    Assessment and Plan:  Pregnancy: G1P0 at 251w0d. Encounter for supervision of other normal pregnancy in second trimester - HORIZON Basic Panel - ABO/Rh - Antibody screen - CBC - Hepatitis B surface antigen - HIV Antibody (routine testing w rflx) - HIV (Save tube for possible reflex) - RPR - Rubella screen - Hepatitis C antibody - Urine Culture - Cervicovaginal ancillary only( [place]) - Hemoglobin A1c - USKoreaFM OB DETAIL +14  WK; Future - Panorama Prenatal Test Full Panel  2. Late prenatal care - First visit at 26 weeks  - Patient wasn't aware she was pregnant until recently, UPT at endocrinology  - Patient concerned about massage and Etoh intake in early pregnancy, USKoreardered for anatomy   3. BMI 34.0-34.9,adult - Hemoglobin A1c   Preterm labor/second trimester warning symptoms and general obstetric precautions including but not limited to vaginal bleeding, contractions, leaking of fluid and fetal movement were reviewed in detail with the patient. Please refer to After Visit Summary for other counseling recommendations.   Discussed the normal visit cadence for prenatal care Discussed the nature of our practice with multiple providers including residents and students   Return in about 2 weeks (around 01/17/2023) [facility] (fasting), any provider.  Future Appointments  Date Time Provider [place]3/10/2023  7:30 AM [facility] NURSE [facility]  7:45 AM [facility] US4 [facility]/25[facility]2024  8:30 AM LB ENDO/NEURO LAB [facility] None  06/19/2023  8:10 AM Shamleffer, IbMelanie CrazierMD [facility] None    JuKerry HoughPA-C

## 2023-01-03 NOTE — Progress Notes (Unsigned)
New OB Intake  I explained I am completing New OB Intake today. We discussed EDD of 04/11/23 that is based on LMP of 07/05/22. Pt is G1/P0. I reviewed her allergies, medications, Medical/Surgical/OB history, and appropriate screenings. I informed her of [facility] services. . Based on history, this is a low risk pregnancy.  Patient Active Problem List   Diagnosis Date Noted   Encounter for supervision of normal first pregnancy in second trimester 01/03/2023   Late prenatal care 01/03/2023   Elevated prolactin level 12/08/2019   Subclinical hypothyroidism 12/10/2018   Hashimoto's disease 12/10/2018    Concerns addressed today  Delivery Plans Plans to deliver at [facility]. Patient given information for [facility] Healthy Baby website for more information about [facility] and [place]. Patient  is unsure if she is  interested in water birth. Offered upcoming OB visit with CNM to discuss further.  MyChart/Babyscripts MyChart access verified. I explained pt will have some visits in office and some virtually. Babyscripts instructions given and order placed. Patient verifies receipt of registration text/e-mail.  Blood Pressure Cuff/Weight Scale Patient is self-pay; explained patient will be given BP cuff at first prenatal appt. Explained after first prenatal appt pt will check weekly and document in 5.   Anatomy US Explained first scheduled [place] will be around 19 weeks. Anatomy US scheduled for 01/13/22 at [facility]. Pt notified to arrive at 0730.  Labs Discussed Johnsie Cancel genetic screening with patient. Would like both Panorama and Horizon drawn at new OB visit. Routine prenatal labs needed.   Social Determinants of Health Food Insecurity: Patient denies food insecurity. WIC Referral: Patient is interested in referral to [facility].  Transportation: Patient denies transportation needs. Childcare: Discussed no children allowed at ultrasound appointments.       Blenda Nicely, RN 01/03/2023  12:47 PM

## 2023-01-03 NOTE — Patient Instructions (Signed)
Childbirth Education Options: [facility] Department Classes:  Childbirth education classes can help you get ready for a positive parenting experience. You can also meet other expectant parents and get free stuff for your baby. Each class runs for five weeks on the same night and costs $45 for the mother-to-be and her support person. Medicaid covers the cost if you are eligible. Call [phone] to register. [facility] Childbirth Education: Classes can vary in availability and schedule is subject to change. For most up-to-date information please visit [email or web address] to review and register.

## 2023-01-04 LAB — CBC
Hematocrit: 35.5 % (ref 34.0–46.6)
Hemoglobin: 11.4 g/dL (ref 11.1–15.9)
MCH: 27.7 pg (ref 26.6–33.0)
MCHC: 32.1 g/dL (ref 31.5–35.7)
MCV: 86 fL (ref 79–97)
Platelets: 389 10*3/uL (ref 150–450)
RBC: 4.11 x10E6/uL (ref 3.77–5.28)
RDW: 13.2 % (ref 11.7–15.4)
WBC: 9 10*3/uL (ref 3.4–10.8)

## 2023-01-04 LAB — ABO/RH: Rh Factor: POSITIVE

## 2023-01-04 LAB — RUBELLA SCREEN: Rubella Antibodies, IGG: <0.9 index — ABNORMAL LOW (ref >0.99)

## 2023-01-04 LAB — HIV ANTIBODY (ROUTINE TESTING W REFLEX): HIV Screen 4th Generation wRfx: NONREACTIVE

## 2023-01-04 LAB — HEMOGLOBIN A1C
Est. average glucose Bld gHb Est-mCnc: 111 mg/dL
Hgb A1c MFr Bld: 5.5 % (ref 4.8–5.6)

## 2023-01-04 LAB — HEPATITIS C ANTIBODY: Hep C Virus Ab: NONREACTIVE

## 2023-01-04 LAB — RPR: RPR Ser Ql: NONREACTIVE

## 2023-01-04 LAB — HEPATITIS B SURFACE ANTIGEN: Hepatitis B Surface Ag: NEGATIVE

## 2023-01-04 LAB — ANTIBODY SCREEN: Antibody Screen: NEGATIVE

## 2023-01-06 ENCOUNTER — Encounter: Payer: Self-pay | Admitting: Medical

## 2023-01-06 DIAGNOSIS — Z2839 Other underimmunization status: Secondary | ICD-10-CM | POA: Insufficient documentation

## 2023-01-06 LAB — CERVICOVAGINAL ANCILLARY ONLY
Chlamydia: NEGATIVE
Comment: NEGATIVE
Comment: NORMAL
Neisseria Gonorrhea: NEGATIVE

## 2023-01-06 LAB — URINE CULTURE

## 2023-01-10 LAB — PANORAMA PRENATAL TEST FULL PANEL:PANORAMA TEST PLUS 5 ADDITIONAL MICRODELETIONS: FETAL FRACTION: 11.7

## 2023-01-12 LAB — HORIZON CUSTOM: REPORT SUMMARY: NEGATIVE

## 2023-01-14 ENCOUNTER — Ambulatory Visit: Payer: Medicaid Other | Admitting: *Deleted

## 2023-01-14 ENCOUNTER — Ambulatory Visit: Payer: Medicaid Other | Attending: Medical

## 2023-01-14 VITALS — BP 122/80 | HR 73

## 2023-01-14 DIAGNOSIS — Z3A27 27 weeks gestation of pregnancy: Secondary | ICD-10-CM | POA: Insufficient documentation

## 2023-01-14 DIAGNOSIS — E063 Autoimmune thyroiditis: Secondary | ICD-10-CM

## 2023-01-14 DIAGNOSIS — O99212 Obesity complicating pregnancy, second trimester: Secondary | ICD-10-CM | POA: Diagnosis not present

## 2023-01-14 DIAGNOSIS — O0932 Supervision of pregnancy with insufficient antenatal care, second trimester: Secondary | ICD-10-CM | POA: Diagnosis not present

## 2023-01-14 DIAGNOSIS — E039 Hypothyroidism, unspecified: Secondary | ICD-10-CM | POA: Insufficient documentation

## 2023-01-14 DIAGNOSIS — Z3402 Encounter for supervision of normal first pregnancy, second trimester: Secondary | ICD-10-CM

## 2023-01-14 DIAGNOSIS — O99282 Endocrine, nutritional and metabolic diseases complicating pregnancy, second trimester: Secondary | ICD-10-CM | POA: Diagnosis not present

## 2023-01-14 DIAGNOSIS — Z363 Encounter for antenatal screening for malformations: Secondary | ICD-10-CM | POA: Diagnosis not present

## 2023-01-21 ENCOUNTER — Encounter (HOSPITAL_BASED_OUTPATIENT_CLINIC_OR_DEPARTMENT_OTHER): Payer: Self-pay | Admitting: Advanced Practice Midwife

## 2023-01-21 ENCOUNTER — Ambulatory Visit (INDEPENDENT_AMBULATORY_CARE_PROVIDER_SITE_OTHER): Payer: BLUE CROSS/BLUE SHIELD | Admitting: Advanced Practice Midwife

## 2023-01-21 VITALS — BP 121/77 | HR 71 | Wt 198.0 lb

## 2023-01-21 DIAGNOSIS — Z3402 Encounter for supervision of normal first pregnancy, second trimester: Secondary | ICD-10-CM | POA: Diagnosis not present

## 2023-01-21 DIAGNOSIS — Z23 Encounter for immunization: Secondary | ICD-10-CM

## 2023-01-21 DIAGNOSIS — Z3A28 28 weeks gestation of pregnancy: Secondary | ICD-10-CM

## 2023-01-21 DIAGNOSIS — E039 Hypothyroidism, unspecified: Secondary | ICD-10-CM

## 2023-01-21 DIAGNOSIS — R102 Pelvic and perineal pain: Secondary | ICD-10-CM

## 2023-01-21 LAB — CBC
Hematocrit: 35.3 % (ref 34.0–46.6)
Hemoglobin: 11.2 g/dL (ref 11.1–15.9)
MCH: 28 pg (ref 26.6–33.0)
MCHC: 31.7 g/dL (ref 31.5–35.7)
MCV: 88 fL (ref 79–97)
Platelets: 330 10*3/uL (ref 150–450)
RBC: 4 x10E6/uL (ref 3.77–5.28)
RDW: 14 % (ref 11.7–15.4)
WBC: 9.8 10*3/uL (ref 3.4–10.8)

## 2023-01-21 NOTE — Progress Notes (Signed)
   PRENATAL VISIT NOTE  Subjective:  Joann Fleming is a 27 y.o. G1P0 at [redacted]w[redacted]d being seen today for ongoing prenatal care.  She is currently monitored for the following issues for this low-risk pregnancy and has Hypothyroidism; Hashimoto's disease; Elevated prolactin level; Encounter for supervision of normal first pregnancy in second trimester; Late prenatal care; and Rubella non-immune status, antepartum on their problem list.  Patient reports  pelvic pressure with standing, bending over .  Contractions: Not present. Vag. Bleeding: None.  Movement: Present. Denies leaking of fluid.   The following portions of the patient's history were reviewed and updated as appropriate: allergies, current medications, past family history, past medical history, past social history, past surgical history and problem list.   Objective:   Vitals:   01/21/23 0929  BP: 121/77  Pulse: 71  Weight: 198 lb (89.8 kg)    Fetal Status: Fetal Heart Rate (bpm): 137 Fundal Height: 29 cm Movement: Present     General:  Alert, oriented and cooperative. Patient is in no acute distress.  Skin: Skin is warm and dry. No rash noted.   Cardiovascular: Normal heart rate noted  Respiratory: Normal respiratory effort, no problems with respiration noted  Abdomen: Soft, gravid, appropriate for gestational age.  Pain/Pressure: Present     Pelvic: Cervical exam deferred        Extremities: Normal range of motion.  Edema: None  Mental Status: Normal mood and affect. Normal behavior. Normal judgment and thought content.   Assessment and Plan:  Pregnancy: G1P0 at [redacted]w[redacted]d 1. Encounter for supervision of normal first pregnancy in second trimester --Anticipatory guidance about next visits/weeks of pregnancy given.  - Glucose Tolerance, 2 Hours w/1 Hour - CBC  2. Hypothyroidism, unspecified type --On Synthroid --Labs with endocrinology on 01/26/23  3. [redacted] weeks gestation of pregnancy   4. Pelvic pressure in pregnancy,  antepartum, third trimester --Rest/ice/heat/warm bath/increase PO fluids/Tylenol/pregnancy support belt   --Reviewed s/sx of PTL, reasons to seek care  Preterm labor symptoms and general obstetric precautions including but not limited to vaginal bleeding, contractions, leaking of fluid and fetal movement were reviewed in detail with the patient. Please refer to After Visit Summary for other counseling recommendations.   Return in about 2 weeks (around 02/04/2023) for As scheduled.  Future Appointments  Date Time Provider [place]  01/27/2023  8:30 AM [facility] None  02/06/2023  2:35 PM Megan Salon, MD [facility]  02/18/2023  9:15 AM Amedeo Gory, Kathie Dike, CNM [facility]  03/04/2023  1:35 PM Leftwich-Kirby, Kathie Dike, CNM [facility]  03/18/2023  1:35 PM Megan Salon, MD [facility]  03/26/2023  1:15 PM Megan Salon, MD [facility]  04/02/2023  1:35 PM Megan Salon, MD [facility]  04/07/2023 10:35 AM Megan Salon, MD [facility]  06/19/2023  8:10 AM Shamleffer, Melanie Crazier, MD [facility] None    Fatima Blank, CNM

## 2023-01-22 LAB — GLUCOSE TOLERANCE, 2 HOURS W/ 1HR
Glucose, 1 hour: 149 mg/dL (ref 70–179)
Glucose, 2 hour: 79 mg/dL (ref 70–152)
Glucose, Fasting: 77 mg/dL (ref 70–91)

## 2023-01-27 ENCOUNTER — Other Ambulatory Visit (INDEPENDENT_AMBULATORY_CARE_PROVIDER_SITE_OTHER): Payer: BLUE CROSS/BLUE SHIELD

## 2023-01-27 ENCOUNTER — Other Ambulatory Visit: Payer: Self-pay | Admitting: Internal Medicine

## 2023-01-27 DIAGNOSIS — E063 Autoimmune thyroiditis: Secondary | ICD-10-CM

## 2023-01-28 LAB — TSH: TSH: 2.11 u[IU]/mL (ref 0.35–5.50)

## 2023-02-06 ENCOUNTER — Ambulatory Visit (HOSPITAL_BASED_OUTPATIENT_CLINIC_OR_DEPARTMENT_OTHER): Payer: BLUE CROSS/BLUE SHIELD | Admitting: Obstetrics & Gynecology

## 2023-02-06 VITALS — BP 122/80 | HR 68 | Wt 200.0 lb

## 2023-02-06 DIAGNOSIS — Z3402 Encounter for supervision of normal first pregnancy, second trimester: Secondary | ICD-10-CM

## 2023-02-06 DIAGNOSIS — O0930 Supervision of pregnancy with insufficient antenatal care, unspecified trimester: Secondary | ICD-10-CM

## 2023-02-06 DIAGNOSIS — Z2839 Other underimmunization status: Secondary | ICD-10-CM

## 2023-02-06 DIAGNOSIS — Z3A31 31 weeks gestation of pregnancy: Secondary | ICD-10-CM

## 2023-02-06 DIAGNOSIS — E039 Hypothyroidism, unspecified: Secondary | ICD-10-CM

## 2023-02-06 DIAGNOSIS — O09899 Supervision of other high risk pregnancies, unspecified trimester: Secondary | ICD-10-CM

## 2023-02-06 MED ORDER — LEVOTHYROXINE SODIUM 112 MCG PO TABS: 112.0000 ug | ORAL_TABLET | Freq: Every day | ORAL | 6 refills | Status: DC

## 2023-02-06 NOTE — Progress Notes (Signed)
   PRENATAL VISIT NOTE  Subjective:  Joann Fleming is a 27 y.o. G1P0 at [redacted]w[redacted]d being seen today for ongoing prenatal care.  She is currently monitored for the following issues for this low-risk pregnancy and has Hypothyroidism; Hashimoto's disease; Elevated prolactin level; Encounter for supervision of normal first pregnancy in second trimester; Late prenatal care; and Rubella non-immune status, antepartum on their problem list.  Patient reports no complaints.  Contractions: Not present. Vag. Bleeding: None.  Movement: Present. Denies leaking of fluid.   The following portions of the patient's history were reviewed and updated as appropriate: allergies, current medications, past family history, past medical history, past social history, past surgical history and problem list.   Objective:   Vitals:   02/06/23 1458  BP: 122/80  Pulse: 68  Weight: 200 lb (90.7 kg)    Fetal Status: Fetal Heart Rate (bpm): 129   Movement: Present     General:  Alert, oriented and cooperative. Patient is in no acute distress.  Skin: Skin is warm and dry. No rash noted.   Cardiovascular: Normal heart rate noted  Respiratory: Normal respiratory effort, no problems with respiration noted  Abdomen: Soft, gravid, appropriate for gestational age.  Pain/Pressure: Absent     Pelvic: Cervical exam deferred        Extremities: Normal range of motion.  Edema: Trace  Mental Status: Normal mood and affect. Normal behavior. Normal judgment and thought content.   Assessment and Plan:  Pregnancy: G1P0 at [redacted]w[redacted]d 1. Encounter for supervision of normal first pregnancy in second trimester - taking PNV - recheck 2 weeks  2. [redacted] weeks gestation of pregnancy  3. Late prenatal care  4. Rubella non-immune status, antepartum - plan to vaccinate pp  5. Acquired hypothyroidism - on levothyroxine and followed by Dr. Lonzo Cloud  Preterm labor symptoms and general obstetric precautions including but not limited to vaginal  bleeding, contractions, leaking of fluid and fetal movement were reviewed in detail with the patient. Please refer to After Visit Summary for other counseling recommendations.   Return in about 2 weeks (around 02/20/2023).  Future Appointments  Date Time Provider Department Center  02/18/2023  9:15 AM Leftwich-Kirby, Wilmer Floor, CNM [facility]  03/04/2023  1:35 PM Leftwich-Kirby, Wilmer Floor, CNM [facility]  03/18/2023  1:35 PM Jerene Bears, MD [facility]  03/26/2023  1:15 PM Jerene Bears, MD [facility]  04/02/2023  1:35 PM Jerene Bears, MD [facility]  04/07/2023 10:35 AM Jerene Bears, MD [facility]  06/19/2023  8:10 AM Shamleffer, Konrad Dolores, MD [facility] None    Jerene Bears, MD

## 2023-02-06 NOTE — Patient Instructions (Signed)
[place] [facility] Providers  [place]outheast [place] ([address]) [facility] Brown, MD; Chambliss, MD; Eniola, MD; Hensel, MD; McDiarmid, MD; McIntyer, MD [address] [place], [place] [address] [phone] Mon-Fri 8:30-12:30, 1:30-5:00  Providers come to see babies during newborn hospitalization Only accepting infants of Mother's who are seen at Family Medicine Center or have siblings seen at   [facility] Center Medicaid - Yes; Tricare - Yes   [facility] Mulberry, MD [address] [place], [place] [address] [phone] Mon, Tue, Thur, Fri 8:30-5:00, Wed 10:00-7:00 (closed 1-2pm daily for lunch) Takes [place] residents with no insurance.  [place] [facility] only with Medicaid/insurance; Tricare - no  [facility] Ben-Davies, MD; Brown, MD; Chandler, MD; Ettefagh, MD; Grant, MD; Hanvey, MD; Herrin, MD; Jones,  MD; Lester, MD; McCormick, MD; McQueen, MD; Simha, MD; Stanley, MD; Stryffeler, NP [address], [place], [place] [address] [phone] Mon, Tue, Thur, Fri 8:30-5:30, Wed 9:30-5:30, Sat 8:30-12:30 Only accepting infants of first-time parents or siblings of current patients Hospital discharge coordinator will make follow-up appointment Medicaid - yes; Tricare - yes  [place]heast [place] ([address]) [facility] Cox, MD; Davis, MD; Dovico, MD; Ettefaugh, MD; Lowe, MD; Nation, MD; Slimp, MD; Sumner, MD; Williams, MD [address], [place], [place] [address] [phone] Mon-Fri 8:30-5:00, closed for lunch 12:30-1:30; Sat-Sun 10:00-1:00 Accepting Newborns with commercial insurance only, must call prior to delivery to be accepted into  practice.  Medicaid - no, Tricare - yes   [facility] [address] [place], [place] [address] [phone] or [phone] Mon to Fri 8am to 10pm, Sat 8am to 1pm  (virtual only on weekends) Only accepts Medicaid Healthy Blue pts  [facility]  Artis, MD; Coccaro, MD; [place]ett Gardner, MD; Netherton, NP; Roper, MD; Wilmot, PA-C; Skinner, MD [address] [place], [place] [address] [phone] Mon-Fri 8:30-5:30 Medicaid - yes, Tricare - yes  [place] ([address]) [facility] Warner, MD [address], [place], [place] [address] [phone] Mon, Tues, Wed Fri 8:30-5:00, Sat 8:30-12:00, Closed Thursdays Accepting siblings of established patients and first time mom's if you call prenatally Medicaid- yes; Tricare - yes  [facility] Becker, PA; Hagler, MD; Quinn, PA-C; Scifres, PA; Sun, MD; Swayne, MD;  [address], [place], [place] [address] [phone] Mon-Fri 8:30-5:00, closed for lunch 1-2 Only accepting newborns of established patients Medicaid- no; Tricare - yes  [place] ([address]) [facility] Timberlake, MD; [address], [place], [place] [address] [phone] Mon-Fri 8:00-5:00 Medicaid - No; Tricare - Yes  [facility] [employer]  Brake, NP; Wharton, PA [address], [place], [place] [address] [phone] Mon-Fri 8:00-5:00 Medicaid - No, Tricare - Yes  [facility] Gay, MD; Quinlan, MD; Blatt, DNP [address] [place], [place] [address] [phone]  Mon-Fri 8:00-5:00 Medicaid - No; Tricare - Yes  [facility] [address] [place], [place] [address] [phone] Mon-Fri 8:30-5:00 (lunch 12:00-1:00) Medicaid -Yes; Tricare - Yes  [facility] Jordan, MD [address], [place], [place] [address] [phone] Mon-Fri 8:00-5:00 Seeing newborns of current patients only. No new patients Medicaid - No, Tricare - yes  [facility] Parker, MD [address] , [place] [address] [phone] Mon-Fri 8:00-5:00 Medicaid -yes as secondary coverage only;   Tricare - yes  [facility] Brecken, PA; Christy, NP; Dees, MD; DeClaire, MD; DeWeese, MD; Hodge, PA; Smoot, NP; Summer, MD; Vapne, MD [address] [place], [place] [address] [phone] Mon-Fri 8:30-5:00, Sat 9:00-11:00 Accepts commercial insurance ONLY. Offers free prenatal information sessions for families. Medicaid - No, Tricare - Call first  [facility] Bouska, MD; Gordon, PA; Jeffery, PA; Weber, PA [address] [place] [address] [phone] Mon-Fri 7:30-5:30 Medicaid - Yes; Tricare - yes  [place] ([address] & [address])  [facility] Reese, MD [address] [place], [place] [address] [phone] Mon-Thur 8:00-6:00, closed for lunch 12-2, closed Fridays Medicaid - yes; Tricare - no  [facility] Anderson, NP; Badger, MD; Beal, PA; Spencer, PA [address], [place], [place] [address] [phone] Mon-Fri 7:30-4:30 Medicaid - yes, Tricare - yes  [facility]  Agbuya, MD; Klett, NP; Romgoolam, MD; Rothstein, NP [address], [place], [place] [address] [phone] Mon-Fri 8:30-5:00, closed for lunch 1-2, Sat 8:30-12:00 - sick visits only Providers come to see babies at [facility] Only accepting newborns of siblings and first time parents ONLY if who have met with office prior to delivery Medicaid -Yes; Tricare - yes  [facility] [place]  Golden, DO; Friddle, NP; Wallace, MD; Wood, MD:  [address], [place], [place] [address] [phone] Mon- Fri 8:00-5:00, Sat 9:00-12:00 - sick visits only Accepting siblings of established patients and first time mom/baby Medicaid - Yes; Tricare - yes Patients must have vaccinations (baby vaccines)  [place] ([address] &  [address])  [facility] [address] [place], [place] [address] [phone] Mon-Fri 8:00-5:00 Medicaid - no; Tricare - yes  [facility] Briscoe, MD; Schmidt, PA; Moreira, PA [address], [place], [place] [address] [phone] Mon-Fri 8:00-5:00 Medicaid- yes; Tricare - yes  [facility] Boyd, MD; Jones, NP; Osborn, PA [address], [place], [place] [address] [phone] Mon-Fri 8:00-5:00 Medicaid - Yes; Tricare - yes  [place] ([address])  [facility] Atkinson, PA; Calderon, PA; Cummings, MD; Dillard, MD; Henrish, NP; Isenhour, DO; Martin, PA; Olson, MD; Ott, MD; Phillips, MD; Valente, PA; VanDeven, PA; Yonjof, NP [address], [place], [place] [address] [phone] Mon-Fri 8:30-5:00, Sat 9:00-12:00 - sick only Please register online [email or web address] then schedule online or call office Medicaid-Yes; Tricare -yes  [facility]  Dabrusco, MD; Dial, MD; Elvaston, MD; Fleenor, NP; Goolsby, PA; Tonuzi, MD; Turner, NP; West, MD [address], [place], [place] [address] [phone] Mon-Fri 8:00-5:30, Sat&Sun by appointment (phones open at 8:30) Medicaid - Yes; Tricare - yes  [place] ([address]) [facility] Allen, CPNP; Bates, MD; Gordon, MD; Mills, NP; Weinshilboum, DO [address], [place], [place] [address] [phone] M-F 8:00 - 5:15, Sat/Sun 9-12 sick visits only Medicaid - No; Tricare - yes  [facility] [place] [facility]  Brown, PA-C; Cowen, PA-C; Dennis, DO; Fuster, PA-C; Martin, PA-C; Shelton, PA-C; Spry, MD [address] [place],  [address] [phone] Mon-Thur 8:00-7:00, Fri 8:00-5:00 Accepting Medicaid for 13 and under only   [facility] [place] [facility] [place] Hayes, FNP; List, FNP; Moran, MD; Pitonzo, PA-C; Scholer,   MD; Spangle, FNP; Nzenwa, FNP; Jasper, MD; Moran, MD [address] [place], [place] [address] [phone] Mon-Fri 8:30-5:30 Medicaid - Yes; Tricare - yes  [facility]  Kelly, CPNP; Logan, MD; Poth, MD; Ramadoss, MD; Staton, NP [address], [place], [place] [address] [phone] Mon-Thur 8:00-5:30, Fri 8:00-5:00, Sat 9:00-12:00 Medicaid - yes, Tricare - yes  [place] ([address])  [facility] [place] Masneri, DO; Meyers, MD; Nelson, PA [address], [place], [place] [address] [phone] Mon-Fri 8:00-5:00, closed for lunch 12-1 Medicaid - No; Tricare - yes  [facility] [place] McGowen, MD [address], [place], [place] [address] [phone] Mon-Fri 8:00-5:00 Medicaid - No; Tricare - yes  [facility] [place] MacDonald, MD; Nayak, MD; Kearns, MD; Jones, MD [address] [place] [address], [place], [place] [address] [phone] Mon-Fri 8:00-5:00 Medicaid- Yes; Tricare - yes  [place] ([address])  [facility] [place] Martin, PA-C; Tabori, MD [address], [place], [place] [address] [phone] Mon-Fri 8:00-5:00 Medicaid - No; Tricare - yes  [facility] [place]  Margin - CPNP [address], [place], [place] [address] [phone] Mon-Weds 8:00-6:00, Thurs-Fri 8:00-5:00, Sat 9:00-12:00 Medicaid - yes; Tricare - yes   [facility] [place] Aubuchon, MD; Brandon, PA [address] [place], [place] [address] [phone] Mon-Fri 8:00-5:00 Medicaid - yes; Tricare - yes  [place] [facility] Providers  [facility] [address], [place], [place] [address] [phone] M, Thur: 8am -8pm, Tues, Weds: 8am - 5pm; Fri: 8-1 Medicaid - Yes; Tricare -  yes  [facility] Mertz, MD; Johnson, MD; Wells, MD; Downs, PA; Hockenberger, PA [address], [place], [place] [address] [phone] M-F 8:30 - 5:00 Medicaid - Call office; Tricare -yes  [facility] Bonney, MD; Page, MD, Minter, MD; Mueller, PNP; Thomason, NP [address], [place], [place] [address] [phone] M-F 8:30 - 5:00, Sat/Sun 8:30 - 12:30 (sick visits) Medicaid - Call office; Tricare -yes  [facility] Lewis, MD; Shaub, PNP; Boylston, MD; Quaile, PA; Nonato, NP; Landon, CPNP [address], [place], [place] [address] [phone] M-F 8:30 - 5:00 Medicaid - Call office; Tricare - yes  [facility] [place] Cline, MD; Dvergsten, MD; Flores, MD; Kawatu, MD; Nogo, MD [address], [place], [place] [address] [phone] M-Thur: 8:00 - 5:00; Fri: 8:00 - 4:00 Medicaid - yes; Tricare - yes  [facility] [address] [place], [place], [place] [address] [phone] M-F: 8:30- 5:00, closed for lunch 12:30 - 1:00 Medicaid - yes; Tricare -yes  [facility] [place] [address], [place], [place] [address] [phone] M-F 8:00 - 5:00 Medicaid - yes; Tricare - yes  [facility] Johnson, DO; Rumball, DO; Wicker, NP [address] [place] [address], [place], [place] [address] [phone] M-F 8:00 - 5:00, Closed 12-1 for lunch Medicaid - Call; Tricare - yes  [facility] Stein, MD [address], [place], [place] [address] [phone] M-F: 8:00-5:00, Sat: 8:00 - noon Medicaid - call; Tricare -yes  [place] [facility] Providers  [facility] Collins, FNP-C [address], [place],  [address] [phone] M-W: 8:00-5:00, Thur: 8:00 - 7:00, Fri: 8:00 - noon Medicaid - yes; Tricare - yes  [facility] [place] Adams, FNP [address], [place],   [place] [address] [phone] M-F 8:00 - 5:00, Closed for lunch 12-1 Medicaid -  yes; Tricare - yes  [place] [facility] Providers  [facility] Smith, FNP, Melvin, MD, Fay, FNP-C [address] [place] [address] [place], [place], [place] [address] [phone] M-T 8:00-5:00, Wed-Fri 7:00-6:00 Medicaid - Yes; Tricare -yes  [facility] [place] Civiletti, DO; [address], [place], [place] [address] [phone] M-F 8:00 - 5:00, closed for lunch 12-1 Medicaid - Yes; Tricare - yes  [facility]  Barnes, MD; Bergdolt, MD; Caulfield, MD; Emrich, MD; Fiscus, MD; Hoppens, MD; Kylstra, MD, McPherson, MD; Todd, MD; Prestwood, MD; Waters, MD; Wood, MD [address], [place], [place] [address] [phone] M-F 8:00-5:00 Medicaid - yes; Tricare - yes  [facility] Cheema, MD (speaks Punjabi and Hindi) [address] [place], [place] [address] [phone] M-F: 8:30 - 5:00, closed 12:30 - 1 for lunch Medicaid - Yes; Tricare -yes  [place] [facility] Providers  [facility] Loda, MD; Timberlake, MD; Burke, MD [address], [place], [place] [address] [phone] M-Th: 8:00 - 5:30, Fri: 8:00 - 12:00 Medicaid - yes; Tricare - yes  [facility] [place] Lookabill, NP; Meier, MD; Daffron, MD [address], [place], [place] [address] [phone] M-F: 8:00 - 5:00 Medicaid - yes; Tricare - yes  [facility] Busse, NP; Bowman, NP; Baune, NP; Entwistle, MD; Williams, MD, Huffman, NP, Ferguson, MD; Patel, DO [address], [place], [place] [address] [phone] M-F: 8:30 - 5:30p Medicaid - yes; Tricare - yes Other locations available as well  [facility] Rajan, MD; Wilson, MD; Morgan, PA-C, Domenech, PA-C; Myers, PA-C [address], [place], [place] [address] [phone] M-W: 8:00am - 7:00pm, Thurs: 8:00am - 8:00pm; Fri: 8:00am -  5:00pm, closed daily from 12-1 for lunch Medicaid - yes; Tricare - yes  [place] [facility] Providers  [facility] Adams, MD; Crystal, FNP; Hadley, MD; Stokes, MD; Johnson, PNP; Brady, PA-C; West, PNP; Gardner, MD;  [address], [place], [place] [address] [phone] M - Fri: 8am - 5pm, Sat 9-noon Medicaid - Yes; Tricare -yes  [facility] [place] Nayak, MD; Jones, FNP; McDonald, MD; Kearns, MD [address] [place] [address], [place], [place] [phone] M-F 8:00 - 5:00 Medicaid - call; Tricare - yes  [facility] [place] Bell, MD; Emory, PNP; Pinder, MD; Anderson, MD; Light, PA-C; Johnson, MD; Latta, MD; Saul, PNP; Rainey, MD; Clifford, MD; McClung, MD [address], [place], [place] [address] [phone] M-F 8:00am - 5:00pm; Sat. 9:00 - 11:00 Medicaid - yes; Tricare - yes  [facility] [place] Soldato-Couture, MD [address], [place], [place] [address] [phone] M-F 8:00 - 5:00 Medicaid - [place] Medicaid only; Tricare - yes  [facility] [place] Walker, MD; Davis, PNP; Ajizian, MD [address] [place] [address], [place], [place] [address] [phone] M-F 8:00 - 5:00 Medicaid - yes; Tricare - yes  [facility] [place] Barry, MD; Brown, MD, Forest, MD, Hazek, MD; Hoyle, MD; Smith, MD; [address] [place], [place], [place] [address] [phone] M-F: 8-5 Medicaid - yes; Tricare - yes  [facility] [place] Brady, Md; Dowlen, MD; [address] [place] [address], [place],  [address] [phone] M-F 8-5 Medicaid - yes; Tricare - yes  [facility] Kearns, MD; Nayak, MD;   Sold[place]-Courture, MD; Pellam-Palmer, DNP; Herring, PNP [address], [place], [place] [address] [phone] M-F 8-5 Medicaid - yes; Tricare - yes  [facility] Weathers, MD;  Merritt, PA-C; Davis-PA-C; Warnimont, MD [address], [place], [place] [address] [phone] M-F 7am - 5 pm Medicaid - call; Tricare - yes  [facility] Hill, PNP; Erickson, MD; Robinson, MD [address], [place], [place] [address] [phone] M-F 8-5 Medicaid - yes; Tricare - yes  [facility] Kribbs, MD; [place], MD; Williams, FNP; Brooks, FNP; Boles, FNP; Romblad, PA-C; [place] - FNP [address], [place], [place] [address] [phone] M-F 8-5 Medicaid- yes; Tricare - yes  [facility] [place] [place] [place] [facility] [place] Yoder, MD; Verenes, MD; Armentrout, MD; Stewart, MD; Beasley, CPNP; Ford, MD; Erickson, MD; Rice, MD [address], [place], [place] [address] [phone] M-F: 8-5, Sat: 9-4, Sun 9-12 Medicaid - yes; Tricare - yes  [facility] Little, PNP; Davis, PNP [address], [place], [place] [address] [phone] M-F 8 - 5, closed 12-1 for lunch Medicaid - yes; Tricare - yes  [facility] Friesen, MD; Cnegia, MD; Rice, MD; Patel, DO [address], [place], [place] [address] [phone] M-F 8- 5:30 Medicaid - yes; Tricare - yes  [facility] [place] Zvolensky, MD; Ray, MD; Haas, MD [address] [address], [place], [place] [address] [phone] M: 8-7; Tues-Fri: 8-5; Sat: 9-12 Medicaid - yes; Tricare - yes  [facility] [place] Heinrich, MD; Meyer, MD; Clark, MD; Rhyne, MD; Aubuchon, MD [address], [place], [place] [address] [phone] M: 8-7; Tues-Fri: 8-5; Sat: 8:30-12:30 Medicaid - yes; Tricare - yes  [facility] [place] Bista, MD; Dillard, PA [address], [place], [place] [address] [phone] Mon-Fri: 8-5 Medicaid - yes;  Tricare - yes  [facility] [place] Beasley, CPNP; Mahle, CPNP; Rice, MD; Duffy, MD; Culler, MD; [address], [place], [place] [address] [phone] M-F: 8-5, closed 1-2 for lunch Medicaid - yes; Tricare - yes  [facility] Rickman, PA; Mounce, NP; Smith, MD; Jordan, CPNP; Darty, PA; Ball, MD; Wallace, MD [address] [place] [address], [place], [place] [address] [phone] M-Thurs: 8-7; Fri: 8-6; Sat: 9-12; Sun 2-4 Medicaid - yes; Tricare - yes  [facility] Brown, MD; Shin, MD; Goodman, DNP, FNP; Sebesta, DO; [address], [place], [place] [address] [phone] M-F: 8-5 Medicaid - yes; Tricare - yes  [place] [facility] Providers  [facility] Dough, MD; Welsh, NP [address], [place], [place] [address] [phone] M - Fri: 8am - 5pm, closed for lunch 12-1 Medicaid - Yes; Tricare - yes  [facility] Manandhar, MD; Riley, MD; Sanger, DO; Vinocur, MD;Hall, PA; Walsh, PA; Campbell, NP [address], [place] [address] [phone] M-F 8:00 - 5:00, Sat 8:00 - 11:30 Medicaid - yes; Tricare - yes  [facility] Khan, MD; Redding, MD, Street, MD, Holt, MD, Burgart, MD; Rhyne, NP; Dickinson, PA;  [address], [place],  [address] [phone] M-F 8:10am - 5:00pm Medicaid - yes; Tricare - yes    [facility] Connors, MD; Kime, NP [address], [place], [place] [address] [phone] M-F 8:00 - 5:00 Medicaid - [place] Medicaid only; Tricare - yes  [facility] [place] Whyte, MD; Fox, NP [address], [place], [place] [address] [phone] M-F 8:00 - 5:00; Closed for lunch 12 - 1:00 Medicaid - yes;  Tricare - yes  [facility] Penner, MD; Wilburn, FNP [address] [place], [place], [place] [address] [phone] Mon 9-5; Tues/Wed 10-5; Thurs 8:30-5; Fri: 8-12:30 Medicaid - yes; Tricare - yes  [place] [facility] Providers  [facility]  Golding, MD; Jackson, PA-C [address], [place], [place] [address] [phone] phone [phone] fax M-F 7:15 - 4:30 Medicaid - yes; Tricare - yes  [facility] Gosrani, MD; Meccariello, DO [address] [place], [place] [address] [phone] M-Fri: 8:30 - 5:00, closed for lunch everyday noon - 1pm Medicaid - Yes; Tricare - yes  [facility] Burdine, MD; Daniel, MD; Howard, MD; Sasser, MD; Boles, PA; Boyd, PA-C; Carroll, PA; McGee, PA; Skillman, PA; Wilson, PA [address] [place], [place] [address] [phone] M-Thurs: 7:30am - 7:00pm; Friday 7:30am - 4pm; Sat: 8:00 - 1:00 Medicaid - Yes; Tricare - yes  [facility] [place] Akhbari, MD; Law, MD; Qayumi, MD; Salvador, DO [address], [place], [place] [address] [phone] M-Thur: 8:00 - 5:00, Fri: 8:00 - Noon Medicaid - yes; Tricare - yes No [place] [facility]  [facility] Dettinger, MD; Gottschalk, DO; Hawks, NP; Martin, NP; Morgan, NP; Milian, NP; Rakes, NP; Stacks, MD; Webster, PA [address], [place], [place] [address] [phone] M-F 8:00 - 5:00 Medicaid - yes; Tricare - yes  [facility] Collins, FNP-C; Bucio, FNP-C [address], [place], [place] [address] [phone] M, W, R 8:00-5:00, Tues: 8:00am - 7:00pm; Fri 8:00 - noon Medicaid - Yes; Tricare - yes  [facility] Khan, MD [address] [place] [address] [place], [place] [address] [phone]  M-Thurs 8:30-5:30, Fri: 8:30-12:30pm Medicaid - Yes; Tricare - N  

## 2023-02-11 ENCOUNTER — Other Ambulatory Visit (HOSPITAL_BASED_OUTPATIENT_CLINIC_OR_DEPARTMENT_OTHER): Payer: Self-pay | Admitting: *Deleted

## 2023-02-11 DIAGNOSIS — Z3402 Encounter for supervision of normal first pregnancy, second trimester: Secondary | ICD-10-CM

## 2023-02-17 DIAGNOSIS — E66812 Obesity, class 2: Secondary | ICD-10-CM | POA: Insufficient documentation

## 2023-02-17 DIAGNOSIS — O99210 Obesity complicating pregnancy, unspecified trimester: Secondary | ICD-10-CM | POA: Insufficient documentation

## 2023-02-18 ENCOUNTER — Ambulatory Visit (INDEPENDENT_AMBULATORY_CARE_PROVIDER_SITE_OTHER): Payer: Medicaid Other | Admitting: Advanced Practice Midwife

## 2023-02-18 VITALS — BP 124/97 | HR 73 | Wt 202.6 lb

## 2023-02-18 DIAGNOSIS — R030 Elevated blood-pressure reading, without diagnosis of hypertension: Secondary | ICD-10-CM

## 2023-02-18 DIAGNOSIS — Z3A32 32 weeks gestation of pregnancy: Secondary | ICD-10-CM

## 2023-02-18 DIAGNOSIS — E039 Hypothyroidism, unspecified: Secondary | ICD-10-CM

## 2023-02-18 DIAGNOSIS — O1203 Gestational edema, third trimester: Secondary | ICD-10-CM

## 2023-02-18 DIAGNOSIS — Z3402 Encounter for supervision of normal first pregnancy, second trimester: Secondary | ICD-10-CM

## 2023-02-18 NOTE — Progress Notes (Signed)
   PRENATAL VISIT NOTE  Subjective:  Joann Fleming is a 27 y.o. G1P0 at [redacted]w[redacted]d being seen today for ongoing prenatal care.  She is currently monitored for the following issues for this low-risk pregnancy and has Hypothyroidism; Hashimoto's disease; Elevated prolactin level; Encounter for supervision of normal first pregnancy in second trimester; Late prenatal care; Rubella non-immune status, antepartum; and Obesity affecting pregnancy on their problem list.  Patient reports  edema of legs/feet in last week .  Contractions: Not present. Vag. Bleeding: None.  Movement: Present. Denies leaking of fluid.   The following portions of the patient's history were reviewed and updated as appropriate: allergies, current medications, past family history, past medical history, past social history, past surgical history and problem list.   Objective:   Vitals:   02/18/23 0926 02/18/23 1026  BP: (!) 126/95 (!) 124/97  Pulse: 73   Weight: 202 lb 9.6 oz (91.9 kg)     Fetal Status: Fetal Heart Rate (bpm): 131 Fundal Height: 32 cm Movement: Present     General:  Alert, oriented and cooperative. Patient is in no acute distress.  Skin: Skin is warm and dry. No rash noted.   Cardiovascular: Normal heart rate noted  Respiratory: Normal respiratory effort, no problems with respiration noted  Abdomen: Soft, gravid, appropriate for gestational age.  Pain/Pressure: Absent     Pelvic: Cervical exam deferred        Extremities: Normal range of motion.  Edema: Trace  Mental Status: Normal mood and affect. Normal behavior. Normal judgment and thought content.   Assessment and Plan:  Pregnancy: G1P0 at [redacted]w[redacted]d 1. Encounter for supervision of normal first pregnancy in second trimester --Anticipatory guidance about next visits/weeks of pregnancy given.   2. Hypothyroidism, unspecified type --Stable on meds  3. [redacted] weeks gestation of pregnancy   4. Elevated blood pressure reading without diagnosis of  hypertension --BP elevated today, no hx HTN --No s/sx of PEC, new onset BLE edema --Recheck in 2 days in office --Labs today --Reviewed s/sx of PEC, reasons to seek care  - CBC - Comp Met (CMET) - Protein / creatinine ratio, urine  5. Edema during pregnancy in third trimester --Elevate, lower sodium intake, increase PO fluids  - CBC - Comp Met (CMET) - Protein / creatinine ratio, urine   Preterm labor symptoms and general obstetric precautions including but not limited to vaginal bleeding, contractions, leaking of fluid and fetal movement were reviewed in detail with the patient. Please refer to After Visit Summary for other counseling recommendations.   Return in about 2 weeks (around 03/04/2023) for Plus nurse visit for blood pressure check this week.  Future Appointments  Date Time Provider Department Center  02/21/2023  8:45 AM [facility] OBGYN NURSE [facility]  02/24/2023  3:15 PM [facility] NURSE [facility]  02/24/2023  3:30 PM [facility] US2 [facility]  03/04/2023  1:35 PM Leftwich-Kirby, Wilmer Floor, CNM [facility]  03/18/2023  1:35 PM Jerene Bears, MD [facility]  03/26/2023  1:15 PM Jerene Bears, MD [facility]  04/02/2023  1:35 PM Jerene Bears, MD [facility]  04/07/2023 10:35 AM Jerene Bears, MD [facility]  06/19/2023  8:10 AM Shamleffer, Konrad Dolores, MD [facility] None    Sharen Counter, CNM

## 2023-02-19 LAB — CBC
Hematocrit: 36.5 % (ref 34.0–46.6)
Hemoglobin: 11.8 g/dL (ref 11.1–15.9)
MCH: 28.4 pg (ref 26.6–33.0)
MCHC: 32.3 g/dL (ref 31.5–35.7)
MCV: 88 fL (ref 79–97)
Platelets: 298 10*3/uL (ref 150–450)
RBC: 4.16 x10E6/uL (ref 3.77–5.28)
RDW: 14.6 % (ref 11.7–15.4)
WBC: 8.3 10*3/uL (ref 3.4–10.8)

## 2023-02-19 LAB — COMPREHENSIVE METABOLIC PANEL
ALT: 10 IU/L (ref 0–32)
AST: 19 IU/L (ref 0–40)
Albumin/Globulin Ratio: 1.3 (ref 1.2–2.2)
Albumin: 3.7 g/dL — ABNORMAL LOW (ref 4.0–5.0)
Alkaline Phosphatase: 136 IU/L — ABNORMAL HIGH (ref 44–121)
BUN/Creatinine Ratio: 12 (ref 9–23)
BUN: 6 mg/dL (ref 6–20)
Bilirubin Total: 0.3 mg/dL (ref 0.0–1.2)
CO2: 19 mmol/L — ABNORMAL LOW (ref 20–29)
Calcium: 9 mg/dL (ref 8.7–10.2)
Chloride: 105 mmol/L (ref 96–106)
Creatinine, Ser: 0.52 mg/dL — ABNORMAL LOW (ref 0.57–1.00)
Globulin, Total: 2.8 g/dL (ref 1.5–4.5)
Glucose: 78 mg/dL (ref 70–99)
Potassium: 4.1 mmol/L (ref 3.5–5.2)
Sodium: 137 mmol/L (ref 134–144)
Total Protein: 6.5 g/dL (ref 6.0–8.5)
eGFR: 131 mL/min/{1.73_m2} (ref >59)

## 2023-02-20 LAB — PROTEIN / CREATININE RATIO, URINE
Creatinine, Urine: 249.6 mg/dL
Protein, Ur: 29.9 mg/dL
Protein/Creat Ratio: 120 mg/g creat (ref 0–200)

## 2023-02-21 ENCOUNTER — Ambulatory Visit (INDEPENDENT_AMBULATORY_CARE_PROVIDER_SITE_OTHER): Payer: BLUE CROSS/BLUE SHIELD | Admitting: *Deleted

## 2023-02-21 VITALS — BP 135/87 | HR 82 | Ht 63.0 in | Wt 203.8 lb

## 2023-02-21 DIAGNOSIS — Z0130 Encounter for examination of blood pressure without abnormal findings: Secondary | ICD-10-CM

## 2023-02-21 NOTE — Progress Notes (Signed)
Pt here for BP check. No complaints. Advised pt to continue to check BP and enter into babyscripts weekly. Reasons to seek care discussed. Pt to return in about 2 weeks for routine postpartum visit.

## 2023-02-24 ENCOUNTER — Ambulatory Visit: Payer: BLUE CROSS/BLUE SHIELD | Admitting: *Deleted

## 2023-02-24 ENCOUNTER — Ambulatory Visit: Payer: BLUE CROSS/BLUE SHIELD | Attending: Obstetrics

## 2023-02-24 ENCOUNTER — Encounter: Payer: Self-pay | Admitting: *Deleted

## 2023-02-24 VITALS — BP 132/87 | HR 73

## 2023-02-24 DIAGNOSIS — O99213 Obesity complicating pregnancy, third trimester: Secondary | ICD-10-CM | POA: Insufficient documentation

## 2023-02-24 DIAGNOSIS — O99283 Endocrine, nutritional and metabolic diseases complicating pregnancy, third trimester: Secondary | ICD-10-CM | POA: Diagnosis present

## 2023-02-24 DIAGNOSIS — Z3A33 33 weeks gestation of pregnancy: Secondary | ICD-10-CM | POA: Diagnosis not present

## 2023-02-24 DIAGNOSIS — E039 Hypothyroidism, unspecified: Secondary | ICD-10-CM | POA: Insufficient documentation

## 2023-02-24 DIAGNOSIS — O99210 Obesity complicating pregnancy, unspecified trimester: Secondary | ICD-10-CM | POA: Insufficient documentation

## 2023-02-24 DIAGNOSIS — Z3402 Encounter for supervision of normal first pregnancy, second trimester: Secondary | ICD-10-CM | POA: Diagnosis not present

## 2023-02-24 DIAGNOSIS — O0933 Supervision of pregnancy with insufficient antenatal care, third trimester: Secondary | ICD-10-CM | POA: Diagnosis not present

## 2023-02-25 ENCOUNTER — Other Ambulatory Visit: Payer: Self-pay

## 2023-02-25 DIAGNOSIS — E039 Hypothyroidism, unspecified: Secondary | ICD-10-CM

## 2023-02-25 DIAGNOSIS — O99213 Obesity complicating pregnancy, third trimester: Secondary | ICD-10-CM

## 2023-03-04 ENCOUNTER — Ambulatory Visit (HOSPITAL_BASED_OUTPATIENT_CLINIC_OR_DEPARTMENT_OTHER): Payer: BLUE CROSS/BLUE SHIELD | Admitting: Advanced Practice Midwife

## 2023-03-04 VITALS — BP 132/94 | HR 62 | Wt 207.8 lb

## 2023-03-04 DIAGNOSIS — O133 Gestational [pregnancy-induced] hypertension without significant proteinuria, third trimester: Secondary | ICD-10-CM

## 2023-03-04 DIAGNOSIS — O0993 Supervision of high risk pregnancy, unspecified, third trimester: Secondary | ICD-10-CM

## 2023-03-04 DIAGNOSIS — Z3A34 34 weeks gestation of pregnancy: Secondary | ICD-10-CM

## 2023-03-04 DIAGNOSIS — O1203 Gestational edema, third trimester: Secondary | ICD-10-CM

## 2023-03-05 LAB — COMPREHENSIVE METABOLIC PANEL
ALT: 9 IU/L (ref 0–32)
AST: 19 IU/L (ref 0–40)
Albumin/Globulin Ratio: 1.6 (ref 1.2–2.2)
Albumin: 3.9 g/dL — ABNORMAL LOW (ref 4.0–5.0)
Alkaline Phosphatase: 154 IU/L — ABNORMAL HIGH (ref 44–121)
BUN/Creatinine Ratio: 13 (ref 9–23)
BUN: 8 mg/dL (ref 6–20)
Bilirubin Total: 0.3 mg/dL (ref 0.0–1.2)
CO2: 20 mmol/L (ref 20–29)
Calcium: 9.6 mg/dL (ref 8.7–10.2)
Chloride: 105 mmol/L (ref 96–106)
Creatinine, Ser: 0.61 mg/dL (ref 0.57–1.00)
Globulin, Total: 2.5 g/dL (ref 1.5–4.5)
Glucose: 72 mg/dL (ref 70–99)
Potassium: 4.6 mmol/L (ref 3.5–5.2)
Sodium: 137 mmol/L (ref 134–144)
Total Protein: 6.4 g/dL (ref 6.0–8.5)
eGFR: 126 mL/min/{1.73_m2} (ref >59)

## 2023-03-05 LAB — CBC
Hematocrit: 38.4 % (ref 34.0–46.6)
Hemoglobin: 12.5 g/dL (ref 11.1–15.9)
MCH: 29.3 pg (ref 26.6–33.0)
MCHC: 32.6 g/dL (ref 31.5–35.7)
MCV: 90 fL (ref 79–97)
Platelets: 259 10*3/uL (ref 150–450)
RBC: 4.27 x10E6/uL (ref 3.77–5.28)
RDW: 15.1 % (ref 11.7–15.4)
WBC: 8.9 10*3/uL (ref 3.4–10.8)

## 2023-03-05 NOTE — Progress Notes (Signed)
   PRENATAL VISIT NOTE  Subjective:  Joann Fleming is a 27 y.o. G1P0 at [redacted]w[redacted]d being seen today for ongoing prenatal care.  She is currently monitored for the following issues for this high-risk pregnancy and has Hypothyroidism; Hashimoto's disease; Elevated prolactin level; Encounter for supervision of normal first pregnancy in second trimester; Late prenatal care; Rubella non-immune status, antepartum; and Obesity affecting pregnancy on their problem list.  Patient reports  worsening edema .  Contractions: Not present. Vag. Bleeding: None.  Movement: Present. Denies leaking of fluid.   The following portions of the patient's history were reviewed and updated as appropriate: allergies, current medications, past family history, past medical history, past social history, past surgical history and problem list.   Objective:   Vitals:   03/04/23 1339 03/04/23 1405  BP: (!) 142/100 (!) 132/94  Pulse: 62   Weight: 207 lb 12.8 oz (94.3 kg)     Fetal Status: Fetal Heart Rate (bpm): 137   Movement: Present     General:  Alert, oriented and cooperative. Patient is in no acute distress.  Skin: Skin is warm and dry. No rash noted.   Cardiovascular: Normal heart rate noted  Respiratory: Normal respiratory effort, no problems with respiration noted  Abdomen: Soft, gravid, appropriate for gestational age.  Pain/Pressure: Present     Pelvic: Cervical exam deferred        Extremities: Normal range of motion.  Edema: Trace  Mental Status: Normal mood and affect. Normal behavior. Normal judgment and thought content.   Assessment and Plan:  Pregnancy: G1P0 at [redacted]w[redacted]d 1. Supervision of high risk pregnancy in third trimester --Anticipatory guidance about next visits/weeks of pregnancy given.   2. Gestational hypertension, third trimester --Pt with HTN last visit, normal labwork, elevated again today. Meets criteria for GHTN today.  --No s/sx of PEC, precautions/reasons to seek care reviewed  --Labs  today, NST today, antenatal testing with MFM - CBC - Comp Met (CMET) - Protein / creatinine ratio, urine  3. [redacted] weeks gestation of pregnancy   4. Edema during pregnancy in third trimester --2+ nonpitting BLE and 1+ BUE today, increased within the last few days per pt  Preterm labor symptoms and general obstetric precautions including but not limited to vaginal bleeding, contractions, leaking of fluid and fetal movement were reviewed in detail with the patient. Please refer to After Visit Summary for other counseling recommendations.   No follow-ups on file.  Future Appointments  Date Time Provider Department Center  03/07/2023 10:00 AM [facility] OBGYN NURSE [facility]  03/12/2023  1:15 PM [facility] NST [facility]  03/18/2023  1:35 PM Jerene Bears, MD [facility]  03/19/2023  1:15 PM [facility] NST [facility]  03/26/2023  1:15 PM Jerene Bears, MD [facility]  03/27/2023  2:00 PM [facility] US1 [facility]  04/02/2023  8:55 AM Marny Lowenstein, PA-C [facility]  04/07/2023 10:35 AM Jerene Bears, MD [facility]  06/19/2023  8:10 AM Shamleffer, Konrad Dolores, MD [facility] None    Sharen Counter, CNM

## 2023-03-07 ENCOUNTER — Other Ambulatory Visit (HOSPITAL_BASED_OUTPATIENT_CLINIC_OR_DEPARTMENT_OTHER): Payer: Self-pay | Admitting: Obstetrics & Gynecology

## 2023-03-07 ENCOUNTER — Telehealth (HOSPITAL_COMMUNITY): Payer: Self-pay | Admitting: *Deleted

## 2023-03-07 ENCOUNTER — Ambulatory Visit (HOSPITAL_BASED_OUTPATIENT_CLINIC_OR_DEPARTMENT_OTHER): Payer: BLUE CROSS/BLUE SHIELD

## 2023-03-07 DIAGNOSIS — Z3490 Encounter for supervision of normal pregnancy, unspecified, unspecified trimester: Secondary | ICD-10-CM

## 2023-03-07 NOTE — Telephone Encounter (Signed)
Preadmission screen  

## 2023-03-11 ENCOUNTER — Ambulatory Visit (HOSPITAL_BASED_OUTPATIENT_CLINIC_OR_DEPARTMENT_OTHER): Payer: BLUE CROSS/BLUE SHIELD

## 2023-03-12 ENCOUNTER — Other Ambulatory Visit: Payer: Self-pay | Admitting: Advanced Practice Midwife

## 2023-03-12 ENCOUNTER — Other Ambulatory Visit: Payer: Self-pay

## 2023-03-12 ENCOUNTER — Other Ambulatory Visit (INDEPENDENT_AMBULATORY_CARE_PROVIDER_SITE_OTHER): Payer: BLUE CROSS/BLUE SHIELD

## 2023-03-12 ENCOUNTER — Telehealth (HOSPITAL_BASED_OUTPATIENT_CLINIC_OR_DEPARTMENT_OTHER): Payer: Self-pay | Admitting: *Deleted

## 2023-03-12 ENCOUNTER — Ambulatory Visit: Payer: BLUE CROSS/BLUE SHIELD | Admitting: *Deleted

## 2023-03-12 VITALS — BP 111/89 | HR 64

## 2023-03-12 DIAGNOSIS — O133 Gestational [pregnancy-induced] hypertension without significant proteinuria, third trimester: Secondary | ICD-10-CM

## 2023-03-12 DIAGNOSIS — Z3A35 35 weeks gestation of pregnancy: Secondary | ICD-10-CM | POA: Diagnosis not present

## 2023-03-12 DIAGNOSIS — O139 Gestational [pregnancy-induced] hypertension without significant proteinuria, unspecified trimester: Secondary | ICD-10-CM | POA: Diagnosis not present

## 2023-03-12 NOTE — Telephone Encounter (Signed)
-----   Message from Jerene Bears, MD sent at 03/07/2023 10:49 PM EDT ----- This is the pt that needs documentation in the chart that she cancelled her NST.    I did place orders.    MSM  ----- Message ----- From: Harrie Jeans, RN Sent: 03/05/2023   1:30 PM EDT To: Jerene Bears, MD  Pt is scheduled for AM induction on 03/21/23. I will schedule her for foley bulb placement the day before when she comes in for her next appt.  Selena Batten

## 2023-03-12 NOTE — Telephone Encounter (Signed)
Pt states that she is unable to make it to her NST appt as she does not get off of work in time. Explained importance of testing. Pt to keep follow up appt next week.

## 2023-03-18 ENCOUNTER — Other Ambulatory Visit (HOSPITAL_COMMUNITY)
Admission: RE | Admit: 2023-03-18 | Discharge: 2023-03-18 | Disposition: A | Discharge status: Home / Self Care | Payer: BLUE CROSS/BLUE SHIELD | Source: Ambulatory Visit | Attending: Obstetrics & Gynecology | Admitting: Obstetrics & Gynecology

## 2023-03-18 ENCOUNTER — Ambulatory Visit (HOSPITAL_BASED_OUTPATIENT_CLINIC_OR_DEPARTMENT_OTHER): Payer: BLUE CROSS/BLUE SHIELD | Admitting: Advanced Practice Midwife

## 2023-03-18 ENCOUNTER — Telehealth (HOSPITAL_COMMUNITY): Payer: Self-pay | Admitting: *Deleted

## 2023-03-18 VITALS — BP 142/92 | HR 57 | Wt 208.0 lb

## 2023-03-18 DIAGNOSIS — Z3402 Encounter for supervision of normal first pregnancy, second trimester: Secondary | ICD-10-CM

## 2023-03-18 DIAGNOSIS — O133 Gestational [pregnancy-induced] hypertension without significant proteinuria, third trimester: Secondary | ICD-10-CM

## 2023-03-18 DIAGNOSIS — O0993 Supervision of high risk pregnancy, unspecified, third trimester: Secondary | ICD-10-CM | POA: Diagnosis present

## 2023-03-18 DIAGNOSIS — Z3A36 36 weeks gestation of pregnancy: Secondary | ICD-10-CM | POA: Diagnosis not present

## 2023-03-18 DIAGNOSIS — O1203 Gestational edema, third trimester: Secondary | ICD-10-CM

## 2023-03-18 NOTE — Progress Notes (Signed)
   PRENATAL VISIT NOTE  Subjective:  Joann Fleming is a 27 y.o. G1P0 at [redacted]w[redacted]d being seen today for ongoing prenatal care.  She is currently monitored for the following issues for this high-risk pregnancy and has Hypothyroidism; Hashimoto's disease; Elevated prolactin level; Encounter for supervision of normal first pregnancy in second trimester; Late prenatal care; Rubella non-immune status, antepartum; and Obesity affecting pregnancy on their problem list.  Patient reports no complaints.  Contractions: Irritability. Vag. Bleeding: None.  Movement: Present. Denies leaking of fluid.   The following portions of the patient's history were reviewed and updated as appropriate: allergies, current medications, past family history, past medical history, past social history, past surgical history and problem list.   Objective:   Vitals:   03/18/23 1335  BP: (!) 138/96  Pulse: (!) 57  Weight: 208 lb (94.3 kg)    Fetal Status: Fetal Heart Rate (bpm): 133   Movement: Present     General:  Alert, oriented and cooperative. Patient is in no acute distress.  Skin: Skin is warm and dry. No rash noted.   Cardiovascular: Normal heart rate noted  Respiratory: Normal respiratory effort, no problems with respiration noted  Abdomen: Soft, gravid, appropriate for gestational age.  Pain/Pressure: Absent     Pelvic: Cervical exam performed in the presence of a chaperone        Extremities: Normal range of motion.  Edema: None  Mental Status: Normal mood and affect. Normal behavior. Normal judgment and thought content.   Assessment and Plan:  Pregnancy: G1P0 at [redacted]w[redacted]d 1. Supervision of high risk pregnancy in third trimester --Anticipatory guidance about next visits/weeks of pregnancy given.   - Cervicovaginal ancillary only( [place]) - Culture, beta strep (group b only)  2. Edema during pregnancy in third trimester --Improved today, intermittent BLE  3. Gestational hypertension, third  trimester --BP elevated today, not severe range. Pt normotensive at home and at MFM weekly.  No s/sx of PEC. --IOL Friday, 5/17, with foley bulb insertion 5/16 in the office --Reviewed s/sx of PEC, reasons to seek care  4. [redacted] weeks gestation of pregnancy   Term labor symptoms and general obstetric precautions including but not limited to vaginal bleeding, contractions, leaking of fluid and fetal movement were reviewed in detail with the patient. Please refer to After Visit Summary for other counseling recommendations.   No follow-ups on file.  Future Appointments  Date Time Provider Department Center  03/19/2023  1:15 PM [facility] NST [facility]  03/21/2023  6:45 AM [facility] SCHED ROOM [facility] None  03/26/2023  1:15 PM Jerene Bears, MD [facility]  06/19/2023  8:10 AM Shamleffer, Konrad Dolores, MD [facility] None    Sharen Counter, CNM

## 2023-03-18 NOTE — Telephone Encounter (Signed)
Preadmission screen  

## 2023-03-19 ENCOUNTER — Telehealth (HOSPITAL_COMMUNITY): Payer: Self-pay | Admitting: *Deleted

## 2023-03-19 ENCOUNTER — Ambulatory Visit: Payer: BLUE CROSS/BLUE SHIELD | Admitting: *Deleted

## 2023-03-19 ENCOUNTER — Other Ambulatory Visit: Payer: Self-pay | Admitting: Advanced Practice Midwife

## 2023-03-19 ENCOUNTER — Other Ambulatory Visit (INDEPENDENT_AMBULATORY_CARE_PROVIDER_SITE_OTHER): Payer: BLUE CROSS/BLUE SHIELD

## 2023-03-19 ENCOUNTER — Other Ambulatory Visit: Payer: Self-pay

## 2023-03-19 VITALS — BP 132/90 | HR 58

## 2023-03-19 DIAGNOSIS — Z3A36 36 weeks gestation of pregnancy: Secondary | ICD-10-CM | POA: Diagnosis not present

## 2023-03-19 DIAGNOSIS — O133 Gestational [pregnancy-induced] hypertension without significant proteinuria, third trimester: Secondary | ICD-10-CM

## 2023-03-19 LAB — CERVICOVAGINAL ANCILLARY ONLY
Chlamydia: NEGATIVE
Comment: NEGATIVE
Comment: NORMAL
Neisseria Gonorrhea: NEGATIVE

## 2023-03-19 NOTE — Progress Notes (Signed)
Pt informed that the ultrasound is considered a limited OB ultrasound and is not intended to be a complete ultrasound exam.  Patient also informed that the ultrasound is not being completed with the intent of assessing for fetal or placental anomalies or any pelvic abnormalities.  Explained that the purpose of today's ultrasound is to assess for presentation, BPP and amniotic fluid volume.  Patient acknowledges the purpose of the exam and the limitations of the study.     Pt denies H/A or visual disturbances.  IOL is scheduled on 5/17.

## 2023-03-19 NOTE — Telephone Encounter (Signed)
Preadmission screen  

## 2023-03-20 ENCOUNTER — Ambulatory Visit (INDEPENDENT_AMBULATORY_CARE_PROVIDER_SITE_OTHER): Payer: BLUE CROSS/BLUE SHIELD | Admitting: Obstetrics & Gynecology

## 2023-03-20 VITALS — BP 144/98 | HR 67 | Wt 207.0 lb

## 2023-03-20 DIAGNOSIS — Z2839 Other underimmunization status: Secondary | ICD-10-CM

## 2023-03-20 DIAGNOSIS — Z3403 Encounter for supervision of normal first pregnancy, third trimester: Secondary | ICD-10-CM

## 2023-03-20 DIAGNOSIS — E038 Other specified hypothyroidism: Secondary | ICD-10-CM | POA: Diagnosis not present

## 2023-03-20 DIAGNOSIS — O133 Gestational [pregnancy-induced] hypertension without significant proteinuria, third trimester: Secondary | ICD-10-CM

## 2023-03-20 DIAGNOSIS — O99210 Obesity complicating pregnancy, unspecified trimester: Secondary | ICD-10-CM

## 2023-03-20 DIAGNOSIS — E063 Autoimmune thyroiditis: Secondary | ICD-10-CM

## 2023-03-20 DIAGNOSIS — O09899 Supervision of other high risk pregnancies, unspecified trimester: Secondary | ICD-10-CM

## 2023-03-20 DIAGNOSIS — O0930 Supervision of pregnancy with insufficient antenatal care, unspecified trimester: Secondary | ICD-10-CM

## 2023-03-20 DIAGNOSIS — Z3A36 36 weeks gestation of pregnancy: Secondary | ICD-10-CM

## 2023-03-20 LAB — CBC
Hematocrit: 37.8 % (ref 34.0–46.6)
Hemoglobin: 12.7 g/dL (ref 11.1–15.9)
MCH: 29.8 pg (ref 26.6–33.0)
MCHC: 33.6 g/dL (ref 31.5–35.7)
MCV: 89 fL (ref 79–97)
Platelets: 201 x10E3/uL (ref 150–450)
RBC: 4.26 x10E6/uL (ref 3.77–5.28)
RDW: 16.5 % — ABNORMAL HIGH (ref 11.7–15.4)
WBC: 9.4 x10E3/uL (ref 3.4–10.8)

## 2023-03-20 LAB — COMPREHENSIVE METABOLIC PANEL WITH GFR
ALT: 14 IU/L (ref 0–32)
AST: 22 IU/L (ref 0–40)
Albumin/Globulin Ratio: 1.4 (ref 1.2–2.2)
Albumin: 4 g/dL (ref 4.0–5.0)
Alkaline Phosphatase: 175 IU/L — ABNORMAL HIGH (ref 44–121)
BUN/Creatinine Ratio: 13 (ref 9–23)
BUN: 10 mg/dL (ref 6–20)
Bilirubin Total: 0.2 mg/dL (ref 0.0–1.2)
CO2: 24 mmol/L (ref 20–29)
Calcium: 9.8 mg/dL (ref 8.7–10.2)
Chloride: 104 mmol/L (ref 96–106)
Creatinine, Ser: 0.77 mg/dL (ref 0.57–1.00)
Globulin, Total: 2.8 g/dL (ref 1.5–4.5)
Glucose: 82 mg/dL (ref 70–99)
Potassium: 4.9 mmol/L (ref 3.5–5.2)
Sodium: 137 mmol/L (ref 134–144)
Total Protein: 6.8 g/dL (ref 6.0–8.5)
eGFR: 109 mL/min/1.73 (ref >59)

## 2023-03-20 NOTE — Progress Notes (Signed)
   PRENATAL VISIT NOTE  Subjective:  Joann Fleming is a 27 y.o. G1P0 at [redacted]w[redacted]d being seen today for ongoing prenatal care.  She is currently monitored for the following issues for this high-risk pregnancy and has Hypothyroidism; Hashimoto's disease; Elevated prolactin level; Encounter for supervision of normal first pregnancy in second trimester; Late prenatal care; Rubella non-immune status, antepartum; and Obesity affecting pregnancy on their problem list.  Patient reports no complaints.  Specifically, denies headache, RUQ pain, visual changes.  H/o PIH.  Here for possible foley bulb placement as well.  Had BPP yesterday.    Contractions: Irregular. Vag. Bleeding: None.  Movement: Present. Denies leaking of fluid.   The following portions of the patient's history were reviewed and updated as appropriate: allergies, current medications, past family history, past medical history, past social history, past surgical history and problem list.   Objective:   Vitals:   03/20/23 1355 03/20/23 1422  BP: (!) 146/101 (!) 144/98  Pulse: 67   Weight: 207 lb (93.9 kg)     Fetal Status: Fetal Heart Rate (bpm): 131 Fundal Height: 38 cm Movement: Present  Presentation: Vertex  General:  Alert, oriented and cooperative. Patient is in no acute distress.  Skin: Skin is warm and dry. No rash noted.   Cardiovascular: Normal heart rate noted  Respiratory: Normal respiratory effort, no problems with respiration noted  Abdomen: Soft, gravid, appropriate for gestational age.  Pain/Pressure: Present     Pelvic: Cervical exam performed in the presence of a chaperone Dilation: 3 Effacement (%): 50 Station: -3  Extremities: Normal range of motion.  Edema: Trace  Mental Status: Normal mood and affect. Normal behavior. Normal judgment and thought content.   Assessment and Plan:  Pregnancy: G1P0 at [redacted]w[redacted]d 1. Encounter for prenatal care of first pregnancy, third trimester - on PNV - induction scheduled  tomorrow - given cervical exam today, foley bulb placement not needed - GBS done Tuesday IS STILL PENDING  2. Gestational hypertension, third trimester - pt has not had lab work in last two weeks.  Called 1st attending today to discuss induction today vs stat labs in office.  Given no other concerning symptoms for pre-eclampsia, decided to proceed with stat lab work.   - precautions given to pt to go to hospital with any signs/symptoms of pre-eclampsia with severe features - Comprehensive metabolic panel - CBC  3. [redacted] weeks gestation of pregnancy  4. Hypothyroidism due to Hashimoto's thyroiditis - on levothyroxine.  Followed by Dr. Lonzo Cloud.  Did have lab work 01/27/23.  5. Late prenatal care  6. Obesity affecting pregnancy, antepartum, unspecified obesity type - hba1c and GTT were normal  7. Rubella non-immune status, antepartum - plan post partum vaccination  Preterm labor symptoms and general obstetric precautions including but not limited to vaginal bleeding, contractions, leaking of fluid and fetal movement were reviewed in detail with the patient. Please refer to After Visit Summary for other counseling recommendations.   No follow-ups on file.  Future Appointments  Date Time Provider Department Center  03/21/2023  6:45 AM [facility] SCHED ROOM [facility] None  05/07/2023  2:35 PM Jerene Bears, MD [facility]  06/19/2023  8:10 AM Shamleffer, Konrad Dolores, MD [facility] None    Jerene Bears, MD

## 2023-03-21 ENCOUNTER — Encounter (HOSPITAL_COMMUNITY): Payer: Self-pay | Admitting: Obstetrics & Gynecology

## 2023-03-21 ENCOUNTER — Inpatient Hospital Stay (HOSPITAL_COMMUNITY): Payer: BLUE CROSS/BLUE SHIELD

## 2023-03-21 ENCOUNTER — Inpatient Hospital Stay (HOSPITAL_COMMUNITY): Payer: BLUE CROSS/BLUE SHIELD | Admitting: Anesthesiology

## 2023-03-21 ENCOUNTER — Other Ambulatory Visit (HOSPITAL_BASED_OUTPATIENT_CLINIC_OR_DEPARTMENT_OTHER): Payer: Self-pay | Admitting: Obstetrics & Gynecology

## 2023-03-21 ENCOUNTER — Inpatient Hospital Stay (HOSPITAL_COMMUNITY)
Admission: AD | Admit: 2023-03-21 | Discharge: 2023-03-24 | DRG: 807 | Disposition: A | Discharge status: Home / Self Care | Payer: BLUE CROSS/BLUE SHIELD | Attending: Obstetrics and Gynecology | Admitting: Obstetrics and Gynecology

## 2023-03-21 ENCOUNTER — Other Ambulatory Visit: Payer: Self-pay

## 2023-03-21 DIAGNOSIS — O134 Gestational [pregnancy-induced] hypertension without significant proteinuria, complicating childbirth: Principal | ICD-10-CM | POA: Diagnosis present

## 2023-03-21 DIAGNOSIS — O99284 Endocrine, nutritional and metabolic diseases complicating childbirth: Secondary | ICD-10-CM | POA: Diagnosis present

## 2023-03-21 DIAGNOSIS — E66812 Obesity, class 2: Secondary | ICD-10-CM | POA: Diagnosis present

## 2023-03-21 DIAGNOSIS — O99214 Obesity complicating childbirth: Secondary | ICD-10-CM | POA: Diagnosis present

## 2023-03-21 DIAGNOSIS — Z3A37 37 weeks gestation of pregnancy: Secondary | ICD-10-CM

## 2023-03-21 DIAGNOSIS — E039 Hypothyroidism, unspecified: Secondary | ICD-10-CM | POA: Diagnosis present

## 2023-03-21 DIAGNOSIS — Z79890 Hormone replacement therapy (postmenopausal): Secondary | ICD-10-CM

## 2023-03-21 DIAGNOSIS — O99824 Streptococcus B carrier state complicating childbirth: Secondary | ICD-10-CM | POA: Diagnosis present

## 2023-03-21 DIAGNOSIS — O09899 Supervision of other high risk pregnancies, unspecified trimester: Secondary | ICD-10-CM

## 2023-03-21 DIAGNOSIS — Z3490 Encounter for supervision of normal pregnancy, unspecified, unspecified trimester: Secondary | ICD-10-CM

## 2023-03-21 DIAGNOSIS — O139 Gestational [pregnancy-induced] hypertension without significant proteinuria, unspecified trimester: Secondary | ICD-10-CM

## 2023-03-21 DIAGNOSIS — E063 Autoimmune thyroiditis: Secondary | ICD-10-CM | POA: Diagnosis present

## 2023-03-21 DIAGNOSIS — O99820 Streptococcus B carrier state complicating pregnancy: Secondary | ICD-10-CM | POA: Diagnosis not present

## 2023-03-21 DIAGNOSIS — Z6835 Body mass index (BMI) 35.0-35.9, adult: Secondary | ICD-10-CM | POA: Diagnosis present

## 2023-03-21 DIAGNOSIS — Z3403 Encounter for supervision of normal first pregnancy, third trimester: Secondary | ICD-10-CM

## 2023-03-21 DIAGNOSIS — O99210 Obesity complicating pregnancy, unspecified trimester: Secondary | ICD-10-CM | POA: Diagnosis present

## 2023-03-21 DIAGNOSIS — Z2839 Other underimmunization status: Secondary | ICD-10-CM

## 2023-03-21 LAB — CBC
HCT: 37.1 % (ref 36.0–46.0)
HCT: 38.4 % (ref 36.0–46.0)
Hemoglobin: 12.3 g/dL (ref 12.0–15.0)
Hemoglobin: 12.9 g/dL (ref 12.0–15.0)
MCH: 29.1 pg (ref 26.0–34.0)
MCH: 29.6 pg (ref 26.0–34.0)
MCHC: 33.2 g/dL (ref 30.0–36.0)
MCHC: 33.6 g/dL (ref 30.0–36.0)
MCV: 87.7 fL (ref 80.0–100.0)
MCV: 88.1 fL (ref 80.0–100.0)
Platelets: 198 10*3/uL (ref 150–400)
Platelets: 194 10*3/uL (ref 150–400)
RBC: 4.23 MIL/uL (ref 3.87–5.11)
RBC: 4.36 MIL/uL (ref 3.87–5.11)
RDW: 15.7 % — ABNORMAL HIGH (ref 11.5–15.5)
RDW: 15.4 % (ref 11.5–15.5)
WBC: 8.6 10*3/uL (ref 4.0–10.5)
WBC: 11.3 10*3/uL — ABNORMAL HIGH (ref 4.0–10.5)
nRBC: 0 % (ref 0.0–0.2)
nRBC: 0 % (ref 0.0–0.2)

## 2023-03-21 LAB — CULTURE, BETA STREP (GROUP B ONLY): Strep Gp B Culture: POSITIVE — AB

## 2023-03-21 LAB — COMPREHENSIVE METABOLIC PANEL
ALT: 14 U/L (ref 0–44)
AST: 25 U/L (ref 15–41)
Albumin: 2.9 g/dL — ABNORMAL LOW (ref 3.5–5.0)
Alkaline Phosphatase: 152 U/L — ABNORMAL HIGH (ref 38–126)
Anion gap: 10 (ref 5–15)
BUN: 8 mg/dL (ref 6–20)
CO2: 20 mmol/L — ABNORMAL LOW (ref 22–32)
Calcium: 8.8 mg/dL — ABNORMAL LOW (ref 8.9–10.3)
Chloride: 105 mmol/L (ref 98–111)
Creatinine, Ser: 0.74 mg/dL (ref 0.44–1.00)
GFR, Estimated: >60 mL/min (ref >60)
Glucose, Bld: 77 mg/dL (ref 70–99)
Potassium: 3.6 mmol/L (ref 3.5–5.1)
Sodium: 135 mmol/L (ref 135–145)
Total Bilirubin: 0.7 mg/dL (ref 0.3–1.2)
Total Protein: 6.6 g/dL (ref 6.5–8.1)

## 2023-03-21 LAB — PROTEIN / CREATININE RATIO, URINE
Creatinine, Urine: 191 mg/dL
Protein Creatinine Ratio: 0.19 mg/mg{Cre} — ABNORMAL HIGH (ref 0.00–0.15)
Total Protein, Urine: 36 mg/dL

## 2023-03-21 LAB — RPR: RPR Ser Ql: NONREACTIVE

## 2023-03-21 LAB — TYPE AND SCREEN
ABO/RH(D): O POS
Antibody Screen: NEGATIVE

## 2023-03-21 MED ORDER — LABETALOL HCL 5 MG/ML IV SOLN: 20.0000 mg | INTRAVENOUS | Status: DC | PRN

## 2023-03-21 MED ORDER — ACETAMINOPHEN 325 MG PO TABS: 650.0000 mg | ORAL_TABLET | ORAL | Status: DC | PRN

## 2023-03-21 MED ORDER — PHENYLEPHRINE 80 MCG/ML (10ML) SYRINGE FOR IV PUSH (FOR BLOOD PRESSURE SUPPORT): 80.0000 ug | PREFILLED_SYRINGE | INTRAVENOUS | Status: DC | PRN

## 2023-03-21 MED ORDER — EPHEDRINE 5 MG/ML INJ: 10.0000 mg | INTRAVENOUS | Status: DC | PRN

## 2023-03-21 MED ORDER — ONDANSETRON HCL 4 MG/2ML IJ SOLN: 4.0000 mg | Freq: Four times a day (QID) | INTRAMUSCULAR | Status: DC | PRN

## 2023-03-21 MED ORDER — SODIUM CHLORIDE 0.9 % IV SOLN
5.0000 10*6.[IU] | Freq: Once | INTRAVENOUS | Status: AC
  Administered 2023-03-21: 5 10*6.[IU] via INTRAVENOUS
  Filled 2023-03-21: qty 5

## 2023-03-21 MED ORDER — MISOPROSTOL 50MCG HALF TABLET
50.0000 ug | ORAL_TABLET | Freq: Once | ORAL | Status: AC
  Administered 2023-03-21: 50 ug via ORAL
  Filled 2023-03-21: qty 1

## 2023-03-21 MED ORDER — LIDOCAINE HCL (PF) 1 % IJ SOLN: 30.0000 mL | INTRAMUSCULAR | Status: DC | PRN

## 2023-03-21 MED ORDER — MISOPROSTOL 25 MCG QUARTER TABLET
25.0000 ug | ORAL_TABLET | Freq: Once | ORAL | Status: AC
  Administered 2023-03-21: 25 ug via VAGINAL
  Filled 2023-03-21: qty 1

## 2023-03-21 MED ORDER — LACTATED RINGERS IV SOLN
500.0000 mL | Freq: Once | INTRAVENOUS | Status: AC
  Administered 2023-03-21: 250 mL via INTRAVENOUS

## 2023-03-21 MED ORDER — MISOPROSTOL 50MCG HALF TABLET: 50.0000 ug | ORAL_TABLET | Freq: Once | ORAL | Status: DC

## 2023-03-21 MED ORDER — FLEET ENEMA 7-19 GM/118ML RE ENEM: 1.0000 | ENEMA | RECTAL | Status: DC | PRN

## 2023-03-21 MED ORDER — LACTATED RINGERS IV SOLN: 500.0000 mL | INTRAVENOUS | Status: DC | PRN

## 2023-03-21 MED ORDER — FENTANYL CITRATE (PF) 100 MCG/2ML IJ SOLN
100.0000 ug | Freq: Once | INTRAMUSCULAR | Status: AC
  Administered 2023-03-21: 100 ug via INTRAVENOUS
  Filled 2023-03-21: qty 2

## 2023-03-21 MED ORDER — OXYCODONE-ACETAMINOPHEN 5-325 MG PO TABS: 2.0000 | ORAL_TABLET | ORAL | Status: DC | PRN

## 2023-03-21 MED ORDER — OXYCODONE-ACETAMINOPHEN 5-325 MG PO TABS: 1.0000 | ORAL_TABLET | ORAL | Status: DC | PRN

## 2023-03-21 MED ORDER — DIPHENHYDRAMINE HCL 50 MG/ML IJ SOLN: 12.5000 mg | INTRAMUSCULAR | Status: DC | PRN

## 2023-03-21 MED ORDER — LEVOTHYROXINE SODIUM 112 MCG PO TABS
112.0000 ug | ORAL_TABLET | Freq: Every day | ORAL | Status: DC
  Filled 2023-03-21 (×2): qty 1

## 2023-03-21 MED ORDER — TERBUTALINE SULFATE 1 MG/ML IJ SOLN: 0.2500 mg | Freq: Once | INTRAMUSCULAR | Status: DC | PRN

## 2023-03-21 MED ORDER — OXYTOCIN-SODIUM CHLORIDE 30-0.9 UT/500ML-% IV SOLN
2.5000 [IU]/h | INTRAVENOUS | Status: DC
  Filled 2023-03-21: qty 500

## 2023-03-21 MED ORDER — FENTANYL-BUPIVACAINE-NACL 0.5-0.125-0.9 MG/250ML-% EP SOLN
12.0000 mL/h | EPIDURAL | Status: DC | PRN
  Administered 2023-03-21: 12 mL/h via EPIDURAL
  Filled 2023-03-21: qty 250

## 2023-03-21 MED ORDER — LABETALOL HCL 5 MG/ML IV SOLN: 40.0000 mg | INTRAVENOUS | Status: DC | PRN

## 2023-03-21 MED ORDER — LACTATED RINGERS IV SOLN: INTRAVENOUS | Status: DC

## 2023-03-21 MED ORDER — LIDOCAINE HCL (PF) 1 % IJ SOLN
INTRAMUSCULAR | Status: DC | PRN
  Administered 2023-03-21 (×2): 5 mL via EPIDURAL

## 2023-03-21 MED ORDER — LABETALOL HCL 5 MG/ML IV SOLN: 80.0000 mg | INTRAVENOUS | Status: DC | PRN

## 2023-03-21 MED ORDER — HYDRALAZINE HCL 20 MG/ML IJ SOLN: 10.0000 mg | INTRAMUSCULAR | Status: DC | PRN

## 2023-03-21 MED ORDER — PENICILLIN G POT IN DEXTROSE 60000 UNIT/ML IV SOLN
3.0000 10*6.[IU] | INTRAVENOUS | Status: DC
  Administered 2023-03-21 (×3): 3 10*6.[IU] via INTRAVENOUS
  Filled 2023-03-21 (×3): qty 50

## 2023-03-21 MED ORDER — SOD CITRATE-CITRIC ACID 500-334 MG/5ML PO SOLN: 30.0000 mL | ORAL | Status: DC | PRN

## 2023-03-21 MED ORDER — OXYTOCIN BOLUS FROM INFUSION
333.0000 mL | Freq: Once | INTRAVENOUS | Status: AC
  Administered 2023-03-22: 333 mL via INTRAVENOUS

## 2023-03-21 NOTE — H&P (Addendum)
OBSTETRIC ADMISSION HISTORY AND PHYSICAL  Joann Fleming is a 27 y.o. female G1P0 with IUP at [redacted]w[redacted]d by LMP presenting for IOL gHTN. She reports +FMs, No LOF, no VB, no blurry vision, headaches or peripheral edema, and RUQ pain.  She plans on breast and bottle feeding. She is declines birth control. She received her prenatal care at  [facility]    Dating: By LMP --->  Estimated Date of Delivery: 04/11/23  Sono:   @[redacted]w[redacted]d , CWD, normal anatomy, cephalic presentation, 2040g, 78% EFW  Prenatal History/Complications:  Patient Active Problem List   Diagnosis Date Noted   Gestational hypertension affecting third pregnancy 03/21/2023   Obesity affecting pregnancy 02/17/2023   Rubella non-immune status, antepartum 01/06/2023   Encounter for prenatal care of first pregnancy, third trimester 01/03/2023   Late prenatal care 01/03/2023   Elevated prolactin level 12/08/2019   Hypothyroidism 12/10/2018   Hashimoto's disease 12/10/2018   Nursing Staff Provider  Office Location [facility] Dating  04/11/2023, by Last Menstrual Period  [facility] Model [x]  Traditional [ ]  Centering [ ]  Mom-Baby Dyad    Language  English Anatomy US  Normal   Flu Vaccine  declined Genetic/Carrier Screen  NIPS:   low risk female AFP:  too late  Horizon: negative  TDaP Vaccine   01/26/2023 Hgb A1C or  GTT Early 5.5 Third trimester 2 hour wnl  COVID Vaccine J&J x 1   LAB RESULTS   Rhogam  O/Positive/-- (03/01 1312)  Blood Type O/Positive/-- (03/01 1312)   Baby Feeding Plan  Breast  Antibody Negative (03/01 1312)  Contraception   Rubella <0.90 (03/01 1312)  Circumcision Considering? RPR Non Reactive (03/01 1312)   Pediatrician   HBsAg Negative (03/01 1312)   Support Person  Husband  HCVAb Negative (03/01 0000) neg  Prenatal Classes  Info given  HIV Non Reactive (03/01 1312)     BTL Consent NA GBS   (For PCN allergy, check sensitivities)   VBAC Consent NA Pap Records requested        DME Rx [ ]  BP cuff [ ]  Weight Scale Waterbirth  [ ]   Class [ ]  Consent [ ]  CNM visit  PHQ9 & GAD7 [  ] new OB [  ] 28 weeks  [x]  36 weeks Induction  [ ]  Orders Entered [ ] Foley Y/N    Past Medical History: Past Medical History:  Diagnosis Date   Abnormal TSH 2018   Anemia    Hypothyroidism     Past Surgical History: Past Surgical History:  Procedure Laterality Date   NO PAST SURGERIES      Obstetrical History: OB History     Gravida  1   Para      Term      Preterm      AB      Living         SAB      IAB      Ectopic      Multiple      Live Births              Social History Social History   Socioeconomic History   Marital status: Single    Spouse name: Not on file   Number of children: Not on file   Years of education: Not on file   Highest education level: Not on file  Occupational History   Not on file  Tobacco Use   Smoking status: Never   Smokeless tobacco: Never  Vaping Use  Vaping Use: Never used  Substance and Sexual Activity   Alcohol use: Not Currently   Drug use: Never   Sexual activity: Yes    Birth control/protection: None  Other Topics Concern   Not on file  Social History Narrative   Not on file   Social Determinants of Health   Financial Resource Strain: Low Risk  (01/03/2023)   Overall Financial Resource Strain (CARDIA)    Difficulty of Paying Living Expenses: Not very hard  Food Insecurity: No Food Insecurity (03/21/2023)   Hunger Vital Sign    Worried About Running Out of Food in the Last Year: Never true    Ran Out of Food in the Last Year: Never true  Transportation Needs: No Transportation Needs (03/21/2023)   PRAPARE - Administrator, Civil Service (Medical): No    Lack of Transportation (Non-Medical): No  Physical Activity: Insufficiently Active (01/03/2023)   Exercise Vital Sign    Days of Exercise per Week: 2 days    Minutes of Exercise per Session: 30 min  Stress: No Stress Concern Present (01/03/2023)   [name] of Occupational  Health - Occupational Stress Questionnaire    Feeling of Stress : Not at all  Social Connections: Moderately Integrated (01/03/2023)   Social Connection and Isolation Panel [NHANES]    Frequency of Communication with Friends and Family: More than three times a week    Frequency of Social Gatherings with Friends and Family: Once a week    Attends Religious Services: More than 4 times per year    Active Member of [employer] or Organizations: No    Attends Engineer, structural: Never    Marital Status: Living with partner    Family History: Family History  Problem Relation Age of Onset   Diabetes Mother    Diabetes type II Mother    Asthma Neg Hx    Cancer Neg Hx    Heart disease Neg Hx    Hypertension Neg Hx     Allergies: No Known Allergies  Medications Prior to Admission  Medication Sig Dispense Refill Last Dose   levothyroxine (SYNTHROID) 112 MCG tablet Take 1 tablet (112 mcg total) by mouth daily. 30 tablet 6 03/21/2023   Prenatal Vit-Fe Fumarate-FA (GOODSENSE PRENATAL VITAMINS) 28-0.8 MG TABS Take 1 tablet by mouth daily. Please fill with prenatal covered by insurance 30 tablet 11      Review of Systems   All systems reviewed and negative except as stated in HPI  Blood pressure (!) 156/89, pulse (!) 56, temperature 97.8 F (36.6 C), temperature source Oral, resp. rate 17, height 5\' 3"  (1.6 m), weight 93.7 kg, last menstrual period 07/05/2022, SpO2 100 %. General appearance: alert and no distress Lungs: normal effort Heart: mild reduced rate noted Abdomen: gravid Presentation: cephalic per RN exam Fetal monitoringBaseline: 130 bpm, Variability: Good {> 6 bpm), Accelerations: Reactive, and Decelerations: Absent Uterine activityFrequency: Every 4-5 minutes Dilation: 3.5 Effacement (%): 50 Station: -3 Exam by:: Darleene Cleaver, RN   Prenatal labs: ABO, Rh: --/--/O POS (05/17 1610) Antibody: NEG (05/17 0748) Rubella: <0.90 (03/01 1312) RPR: Non Reactive (03/01 1312)   HBsAg: Negative (03/01 1312)  HIV: Non Reactive (03/01 1312)  GBS: Positive/-- (05/14 0445)  1 hr Glucola nml Genetic screening  LR Anatomy US nml  Prenatal Transfer Tool  Maternal Diabetes: No Genetic Screening: Normal Maternal Ultrasounds/Referrals: Normal Fetal Ultrasounds or other Referrals:  None Maternal Substance Abuse:  No Significant Maternal Medications:  Synthroid Significant  Maternal Lab Results:  Other: GBS positive Number of Prenatal Visits:greater than 3 verified prenatal visits Other Comments:  None  Results for orders placed or performed during the hospital encounter of 03/21/23 (from the past 24 hour(s))  Protein / creatinine ratio, urine   Collection Time: 03/21/23  7:40 AM  Result Value Ref Range   Creatinine, Urine 191 mg/dL   Total Protein, Urine 36 mg/dL   Protein Creatinine Ratio 0.19 (H) 0.00 - 0.15 mg/mg[Cre]  CBC   Collection Time: 03/21/23  7:48 AM  Result Value Ref Range   WBC 8.6 4.0 - 10.5 K/uL   RBC 4.23 3.87 - 5.11 MIL/uL   Hemoglobin 12.3 12.0 - 15.0 g/dL   HCT 09.8 11.9 - 14.7 %   MCV 87.7 80.0 - 100.0 fL   MCH 29.1 26.0 - 34.0 pg   MCHC 33.2 30.0 - 36.0 g/dL   RDW 82.9 (H) 56.2 - 13.0 %   Platelets 198 150 - 400 K/uL   nRBC 0.0 0.0 - 0.2 %  Comprehensive metabolic panel   Collection Time: 03/21/23  7:48 AM  Result Value Ref Range   Sodium 135 135 - 145 mmol/L   Potassium 3.6 3.5 - 5.1 mmol/L   Chloride 105 98 - 111 mmol/L   CO2 20 (L) 22 - 32 mmol/L   Glucose, Bld 77 70 - 99 mg/dL   BUN 8 6 - 20 mg/dL   Creatinine, Ser 8.65 0.44 - 1.00 mg/dL   Calcium 8.8 (L) 8.9 - 10.3 mg/dL   Total Protein 6.6 6.5 - 8.1 g/dL   Albumin 2.9 (L) 3.5 - 5.0 g/dL   AST 25 15 - 41 U/L   ALT 14 0 - 44 U/L   Alkaline Phosphatase 152 (H) 38 - 126 U/L   Total Bilirubin 0.7 0.3 - 1.2 mg/dL   GFR, Estimated >78 >46 mL/min   Anion gap 10 5 - 15  Type and screen   Collection Time: 03/21/23  7:48 AM  Result Value Ref Range   ABO/RH(D) O POS     Antibody Screen NEG    Sample Expiration      03/24/2023,2359 Performed at [facility], [address], [place], [place] [address]   Results for orders placed or performed in visit on 03/20/23 (from the past 24 hour(s))  Comprehensive metabolic panel   Collection Time: 03/20/23  2:35 PM  Result Value Ref Range   Glucose 82 70 - 99 mg/dL   BUN 10 6 - 20 mg/dL   Creatinine, Ser 2.84 0.57 - 1.00 mg/dL   eGFR 132 >44 WN/UUV/2.53   BUN/Creatinine Ratio 13 9 - 23   Sodium 137 134 - 144 mmol/L   Potassium 4.9 3.5 - 5.2 mmol/L   Chloride 104 96 - 106 mmol/L   CO2 24 20 - 29 mmol/L   Calcium 9.8 8.7 - 10.2 mg/dL   Total Protein 6.8 6.0 - 8.5 g/dL   Albumin 4.0 4.0 - 5.0 g/dL   Globulin, Total 2.8 1.5 - 4.5 g/dL   Albumin/Globulin Ratio 1.4 1.2 - 2.2   Bilirubin Total 0.2 0.0 - 1.2 mg/dL   Alkaline Phosphatase 175 (H) 44 - 121 IU/L   AST 22 0 - 40 IU/L   ALT 14 0 - 32 IU/L  CBC   Collection Time: 03/20/23  2:35 PM  Result Value Ref Range   WBC 9.4 3.4 - 10.8 x10E3/uL   RBC 4.26 3.77 - 5.28 x10E6/uL   Hemoglobin 12.7  11.1 - 15.9 g/dL   Hematocrit 16.1 09.6 - 46.6 %   MCV 89 79 - 97 fL   MCH 29.8 26.6 - 33.0 pg   MCHC 33.6 31.5 - 35.7 g/dL   RDW 04.5 (H) 40.9 - 81.1 %   Platelets 201 150 - 450 x10E3/uL    Patient Active Problem List   Diagnosis Date Noted   Gestational hypertension affecting third pregnancy 03/21/2023   Obesity affecting pregnancy 02/17/2023   Rubella non-immune status, antepartum 01/06/2023   Encounter for prenatal care of first pregnancy, third trimester 01/03/2023   Late prenatal care 01/03/2023   Elevated prolactin level 12/08/2019   Hypothyroidism 12/10/2018   Hashimoto's disease 12/10/2018    Assessment/Plan:  Joann Fleming is a 27 y.o. G1P0 at [redacted]w[redacted]d here for IOL gHTN  #Labor: cytotec 50/25 mcg PO/VAG #Pain: Per pt request #FWB: CAT 1 #ID:  GBS pos, PCN ppx #MOF: Both #MOC: declined #Circ: declined #gHTN: PreE labs wnl, BP elevated.  Labetalol protocol started  Hypothyroidism -Continue synthroid  Syriah Delisi Autry-Lott, DO  03/21/2023, 9:52 AM

## 2023-03-21 NOTE — Progress Notes (Signed)
Labor Progress Note BRANAE WEISCHEDEL is a 27 y.o. G1P0 at [redacted]w[redacted]d presented for IOL  S: pt doing well no s/p epidural feels more pressure in her bottom  O:  BP (!) 144/88   Pulse 63   Temp 98.8 F (37.1 C) (Oral)   Resp 18   Ht 5\' 3"  (1.6 m)   Wt 93.7 kg   LMP 07/05/2022   SpO2 99%   BMI 36.60 kg/m  EFM: 130bpm/moderate/+accels, no decels  CVE: Dilation: Lip/rim Effacement (%): 100 Station: -1 Presentation: Vertex Exam by:: Dr. Camelia Phenes   A&P: 27 y.o. G1P0 [redacted]w[redacted]d here for IOL 2/2 gHTN.   #Labor: S/p AROM and cytotec. Progressign well. #Pain: Epidural #FWB: Cat I  #GBS positive #gHTN: PreE labs nml. BP mild range. Continue to monitor  Lahoma Crocker Mercado-Ortiz, DO 9:30 PM

## 2023-03-21 NOTE — Progress Notes (Addendum)
Labor Progress Note Joann Fleming is a 27 y.o. G1P0 at [redacted]w[redacted]d presented for IOL  S: Expresses fear of induction process.   O:  BP 134/89   Pulse (!) 57   Temp 99 F (37.2 C) (Oral)   Resp 16   Ht 5\' 3"  (1.6 m)   Wt 93.7 kg   LMP 07/05/2022   SpO2 100%   BMI 36.60 kg/m  EFM: 130bpm/moderate/+accels, no decels  CVE: Dilation: 4 Effacement (%): 50 Station: -3 Presentation: Vertex Exam by:: Autry-Lott, DO   A&P: 27 y.o. G1P0 [redacted]w[redacted]d here for IOL 2/2 gHTN.  #Labor: s/p cyto 50/25, followed by vaginal 25 mcg of cytotec. Some improvement of dilation but continues to have a thick cervix. Discussed oral cytotec and/or AROM. The patient is tearful and expresses fear of induction process. I offered her time to speak with her family and will revisit the conversation. She was tearful during our last conversation and seems afraid/unwilling to make decisions at this time. Will plan for SW consult PP.  #Pain: Maternally supported. Does not desire epidural. Planning natural birth. IV pain medications offered if needed #FWB: Cat I  #GBS positive  gHTN BP mild range -Continue to monitor  Lavonda Jumbo, DO OB Fellow, Faculty Practice [facility], [facility] 03/21/2023, 4:50 PM  Addendum: now s/p AROM clear to bloody fluid. Will give an additional oral cytotec. Consider starting pitocin at next exam  Joann Junio Autry-Lott, DO 5:22 PM

## 2023-03-21 NOTE — Progress Notes (Signed)
Labor Progress Note Joann Fleming is a 27 y.o. G1P0 at [redacted]w[redacted]d presented for IOL  S: No acute concerns.  O:  BP 131/82   Pulse (!) 47   Temp 98.3 F (36.8 C) (Oral)   Resp 17   Ht 5\' 3"  (1.6 m)   Wt 93.7 kg   LMP 07/05/2022   SpO2 100%   BMI 36.60 kg/m  EFM: 125bpm/moderate/+accels, no decels  CVE: Dilation: 3.5 Effacement (%): 50 Station: -3 Presentation: Vertex Exam by:: Autry-Lott, DO   A&P: 27 y.o. G1P0 [redacted]w[redacted]d here for IOL 2/2 gHTN.  #Labor: s/p cyto 50/25. Grossly unchanged. Cervical exams are very uncomfortable for the patient. Considered AROM and unlikely to be successful with comfort of exam. Will give another dose of cytotec vaginal 25 mcg and assess for AROM/Pit at next exam.  #Pain: Maternally supported. Does not desire epidural. Planning natural birth. IV pain medications offered if needed #FWB: Cat I  #GBS positive  gHTN BP mild range -Continue to monitor  [employer], DO 12:24 PM

## 2023-03-21 NOTE — Anesthesia Preprocedure Evaluation (Signed)
Anesthesia Evaluation  Patient identified by MRN, date of birth, ID band Patient awake    Reviewed: Allergy & Precautions, Patient's Chart, lab work & pertinent test results  Airway Mallampati: II       Dental no notable dental hx.    Pulmonary neg pulmonary ROS   Pulmonary exam normal        Cardiovascular hypertension, Pt. on medications Normal cardiovascular exam Rhythm:Regular     Neuro/Psych negative neurological ROS  negative psych ROS   GI/Hepatic Neg liver ROS,GERD  ,,  Endo/Other  Hypothyroidism  Obesity  Renal/GU negative Renal ROS  negative genitourinary   Musculoskeletal negative musculoskeletal ROS (+)    Abdominal  (+) + obese  Peds  Hematology  (+) Blood dyscrasia, anemia   Anesthesia Other Findings   Reproductive/Obstetrics (+) Pregnancy                             Anesthesia Physical Anesthesia Plan  ASA: 2  Anesthesia Plan: Epidural   Post-op Pain Management:    Induction:   PONV Risk Score and Plan:   Airway Management Planned: Natural Airway  Additional Equipment:   Intra-op Plan:   Post-operative Plan:   Informed Consent: I have reviewed the patients History and Physical, chart, labs and discussed the procedure including the risks, benefits and alternatives for the proposed anesthesia with the patient or authorized representative who has indicated his/her understanding and acceptance.       Plan Discussed with: Anesthesiologist  Anesthesia Plan Comments:        Anesthesia Quick Evaluation

## 2023-03-21 NOTE — Anesthesia Procedure Notes (Signed)
Epidural Patient location during procedure: OB Start time: 03/21/2023 8:25 PM  Preanesthetic Checklist Completed: patient identified, IV checked, site marked, risks and benefits discussed, surgical consent, monitors and equipment checked, pre-op evaluation and timeout performed  Epidural Patient position: sitting Prep: DuraPrep and site prepped and draped Patient monitoring: continuous pulse ox and blood pressure Approach: midline Location: L3-L4 Injection technique: LOR air  Needle:  Needle type: Tuohy  Needle gauge: 17 G Needle length: 9 cm and 9 Needle insertion depth: 6 cm Catheter type: closed end flexible Catheter size: 19 Gauge Catheter at skin depth: 11 cm Test dose: negative and Other  Assessment Events: blood not aspirated, no cerebrospinal fluid, injection not painful, no injection resistance, no paresthesia and negative IV test  Additional Notes Patient identified. Risks and benefits discussed including failed block, incomplete  Pain control, post dural puncture headache, nerve damage, paralysis, blood pressure Changes, nausea, vomiting, reactions to medications-both toxic and allergic and post Partum back pain. All questions were answered. Patient expressed understanding and wished to proceed. Sterile technique was used throughout procedure. Epidural site was Dressed with sterile barrier dressing. No paresthesias, signs of intravascular injection Or signs of intrathecal spread were encountered.  Patient was more comfortable after the epidural was dosed. Please see RN's note for documentation of vital signs and FHR which are stable. Reason for block:procedure for pain

## 2023-03-22 ENCOUNTER — Encounter (HOSPITAL_COMMUNITY): Payer: Self-pay | Admitting: Obstetrics and Gynecology

## 2023-03-22 DIAGNOSIS — Z3A37 37 weeks gestation of pregnancy: Secondary | ICD-10-CM

## 2023-03-22 DIAGNOSIS — O99820 Streptococcus B carrier state complicating pregnancy: Secondary | ICD-10-CM

## 2023-03-22 DIAGNOSIS — O99214 Obesity complicating childbirth: Secondary | ICD-10-CM

## 2023-03-22 DIAGNOSIS — O134 Gestational [pregnancy-induced] hypertension without significant proteinuria, complicating childbirth: Secondary | ICD-10-CM

## 2023-03-22 DIAGNOSIS — O99284 Endocrine, nutritional and metabolic diseases complicating childbirth: Secondary | ICD-10-CM

## 2023-03-22 LAB — CBC
HCT: 34.5 % — ABNORMAL LOW (ref 36.0–46.0)
Hemoglobin: 11.9 g/dL — ABNORMAL LOW (ref 12.0–15.0)
MCH: 29.5 pg (ref 26.0–34.0)
MCHC: 34.5 g/dL (ref 30.0–36.0)
MCV: 85.4 fL (ref 80.0–100.0)
Platelets: 160 10*3/uL (ref 150–400)
RBC: 4.04 MIL/uL (ref 3.87–5.11)
RDW: 15.5 % (ref 11.5–15.5)
WBC: 14.5 10*3/uL — ABNORMAL HIGH (ref 4.0–10.5)
nRBC: 0 % (ref 0.0–0.2)

## 2023-03-22 LAB — CBC WITH DIFFERENTIAL/PLATELET
Abs Immature Granulocytes: 0.05 10*3/uL (ref 0.00–0.07)
Basophils Absolute: 0 10*3/uL (ref 0.0–0.1)
Basophils Relative: 0 %
Eosinophils Absolute: 0 10*3/uL (ref 0.0–0.5)
Eosinophils Relative: 0 %
HCT: 38 % (ref 36.0–46.0)
Hemoglobin: 12.8 g/dL (ref 12.0–15.0)
Immature Granulocytes: 0 %
Lymphocytes Relative: 13 %
Lymphs Abs: 1.8 10*3/uL (ref 0.7–4.0)
MCH: 29.8 pg (ref 26.0–34.0)
MCHC: 33.7 g/dL (ref 30.0–36.0)
MCV: 88.6 fL (ref 80.0–100.0)
Monocytes Absolute: 0.7 10*3/uL (ref 0.1–1.0)
Monocytes Relative: 5 %
Neutro Abs: 11 10*3/uL — ABNORMAL HIGH (ref 1.7–7.7)
Neutrophils Relative %: 82 %
Platelets: 188 10*3/uL (ref 150–400)
RBC: 4.29 MIL/uL (ref 3.87–5.11)
RDW: 15.4 % (ref 11.5–15.5)
WBC: 13.6 10*3/uL — ABNORMAL HIGH (ref 4.0–10.5)
nRBC: 0 % (ref 0.0–0.2)

## 2023-03-22 MED ORDER — ACETAMINOPHEN 325 MG PO TABS: 650.0000 mg | ORAL_TABLET | ORAL | Status: DC | PRN

## 2023-03-22 MED ORDER — ONDANSETRON HCL 4 MG/2ML IJ SOLN: 4.0000 mg | INTRAMUSCULAR | Status: DC | PRN

## 2023-03-22 MED ORDER — PRENATAL MULTIVITAMIN CH
1.0000 | ORAL_TABLET | Freq: Every day | ORAL | Status: DC
  Administered 2023-03-22 – 2023-03-24 (×3): 1 via ORAL
  Filled 2023-03-22 (×3): qty 1

## 2023-03-22 MED ORDER — IBUPROFEN 600 MG PO TABS
600.0000 mg | ORAL_TABLET | Freq: Four times a day (QID) | ORAL | Status: DC
  Administered 2023-03-22 – 2023-03-24 (×12): 600 mg via ORAL
  Filled 2023-03-22 (×12): qty 1

## 2023-03-22 MED ORDER — SODIUM CHLORIDE 0.9% FLUSH: 3.0000 mL | Freq: Two times a day (BID) | INTRAVENOUS | Status: DC

## 2023-03-22 MED ORDER — SENNOSIDES-DOCUSATE SODIUM 8.6-50 MG PO TABS
2.0000 | ORAL_TABLET | ORAL | Status: DC
  Administered 2023-03-22 – 2023-03-24 (×3): 2 via ORAL
  Filled 2023-03-22 (×3): qty 2

## 2023-03-22 MED ORDER — WITCH HAZEL-GLYCERIN EX PADS: 1.0000 | MEDICATED_PAD | CUTANEOUS | Status: DC | PRN

## 2023-03-22 MED ORDER — MEASLES, MUMPS & RUBELLA VAC IJ SOLR: 0.5000 mL | Freq: Once | INTRAMUSCULAR | Status: DC

## 2023-03-22 MED ORDER — METHYLERGONOVINE MALEATE 0.2 MG/ML IJ SOLN
0.2000 mg | Freq: Once | INTRAMUSCULAR | Status: AC
  Administered 2023-03-22: 0.2 mg via INTRAMUSCULAR
  Filled 2023-03-22: qty 1

## 2023-03-22 MED ORDER — ONDANSETRON HCL 4 MG PO TABS: 4.0000 mg | ORAL_TABLET | ORAL | Status: DC | PRN

## 2023-03-22 MED ORDER — BENZOCAINE-MENTHOL 20-0.5 % EX AERO
1.0000 | INHALATION_SPRAY | CUTANEOUS | Status: DC | PRN
  Administered 2023-03-23: 1 via TOPICAL
  Filled 2023-03-22: qty 56

## 2023-03-22 MED ORDER — SODIUM CHLORIDE 0.9% FLUSH: 3.0000 mL | INTRAVENOUS | Status: DC | PRN

## 2023-03-22 MED ORDER — SODIUM CHLORIDE 0.9 % IV SOLN: 250.0000 mL | INTRAVENOUS | Status: DC | PRN

## 2023-03-22 MED ORDER — LEVOTHYROXINE SODIUM 100 MCG PO TABS
100.0000 ug | ORAL_TABLET | Freq: Every day | ORAL | Status: DC
  Administered 2023-03-22 – 2023-03-24 (×3): 100 ug via ORAL
  Filled 2023-03-22 (×3): qty 1

## 2023-03-22 MED ORDER — ZOLPIDEM TARTRATE 5 MG PO TABS: 5.0000 mg | ORAL_TABLET | Freq: Every evening | ORAL | Status: DC | PRN

## 2023-03-22 MED ORDER — SIMETHICONE 80 MG PO CHEW: 80.0000 mg | CHEWABLE_TABLET | ORAL | Status: DC | PRN

## 2023-03-22 MED ORDER — DIPHENHYDRAMINE HCL 25 MG PO CAPS: 25.0000 mg | ORAL_CAPSULE | Freq: Four times a day (QID) | ORAL | Status: DC | PRN

## 2023-03-22 MED ORDER — DIBUCAINE (PERIANAL) 1 % EX OINT: 1.0000 | TOPICAL_OINTMENT | CUTANEOUS | Status: DC | PRN

## 2023-03-22 MED ORDER — COCONUT OIL OIL: 1.0000 | TOPICAL_OIL | Status: DC | PRN

## 2023-03-22 NOTE — Progress Notes (Signed)
CSW was consulted due to MOB expressing feelings of anxiety and fear during labor. MOB declined social work consult at this time.   Please contact the Clinical Social Worker if needs arise, by MOB request, or if MOB scores greater than 9/yes to question 10 on Edinburgh Postpartum Depression Screen.  Signed,  Alitzel Cookson K. Catalaya Garr, MSW, LCSWA, LCASA 03/22/2023 3:25 PM 

## 2023-03-22 NOTE — Lactation Note (Addendum)
This note was copied from a baby's chart. Lactation Consultation Note  Patient Name: Joann Fleming RUEAV'W Date: 03/22/2023 Age:27 hours Reason for consult: Initial assessment;Early term 37-38.6wks;Infant < 6lbs;1st time breastfeeding  P1, Reviewed hand expression with good flow.  Attempted latching in football and cross cradle.  Brief attempt to no more than 10 min  Minimal feeding cues noted.  Moved baby swaddled to side lying position and offered purple extra slow flow nipple with 22kcal Neosure and baby did not take nipple.  Attempted in semi upright position with nfant white nipple and baby no interest in feeding at this time.  Suggest attempting again with nfant extra slow flow nipple within the next hours or when baby cues.  Goal 12-34ml. Placed baby skin to skin on mother's chest.  LC reviewed volume guidelines and feeding frequency.  Discussed need for SLP consult with RN.   Plan: Keep baby STS as much as possible  Offer breast when baby cues that he is hungry, or awaken baby for feeding at 3 hrs.  Offer breast to baby, asking for help prn.  Limit to 30 mins so not to overtire baby. If baby does not latch after 5-10 min of attempt - give supplemental breastmilk/formula with extra slow flow nipple.  Pump both breasts 15-20 minutes on initiation setting, adding breast massage and hand expression to collect as much colostrum as possible to feed baby.  Feed baby 12-14 ml EBM+/formula after breastfeeding per LPTI volume guidelines increasing per day of life and as baby desires.    Maternal Data Does the patient have breastfeeding experience prior to this delivery?: No  Feeding Mother's Current Feeding Choice: Breast Milk and Formula  Interventions Interventions: Breast feeding basics reviewed;Assisted with latch;Skin to skin;Hand express;Adjust position;Support pillows;DEBP;Education;LC Services brochure  Discharge Pump: Stork Pump (paperwork sent)  Consult Status Consult Status:  Follow-up Date: 03/23/23 Follow-up type: In-patient    Dahlia Byes [facility] 03/22/2023, 9:52 AM

## 2023-03-22 NOTE — Discharge Summary (Signed)
Postpartum Discharge Summary  Date of Service updated***     Patient Name: Joann Fleming DOB: 10/27/1996 MRN: [id number]  Date of admission: 03/21/2023 Delivery date:03/22/2023  Delivering provider: Myrtie Hawk  Date of discharge: 03/22/2023  Admitting diagnosis: Gestational hypertension affecting third pregnancy [O13.9] Intrauterine pregnancy: [redacted]w[redacted]d     Secondary diagnosis:  Principal Problem:   Vaginal delivery Active Problems:   Hypothyroidism   Hashimoto's disease   Encounter for prenatal care of first pregnancy, third trimester   Rubella non-immune status, antepartum   Obesity affecting pregnancy   Gestational hypertension affecting third pregnancy  Additional problems: ***    Discharge diagnosis: Term Pregnancy Delivered and Gestational Hypertension                                              Post partum procedures:{Postpartum procedures:23558} Augmentation: AROM and Cytotec Complications: None  Hospital course: Induction of Labor With Vaginal Delivery   27 y.o. yo G1P0 at [redacted]w[redacted]d was admitted to the hospital 03/21/2023 for induction of labor.  Indication for induction: Gestational hypertension.  Patient had an labor course complicated by none Membrane Rupture Time/Date: 5:18 PM ,03/21/2023   Delivery Method:Vaginal, Spontaneous  Episiotomy: None  Lacerations:  Sulcus  Details of delivery can be found in separate delivery note.  Patient had a postpartum course complicated by***. Patient is discharged home 03/22/23.  Newborn Data: Birth date:03/22/2023  Birth time:12:13 AM  Gender:Female  Living status:Living  Apgars:8 ,9  Weight:   Magnesium Sulfate received: {Mag received:30440022} BMZ received: No Rhophylac:N/A MMR:Yes *** T-DaP:Given prenatally Flu: No Transfusion:{Transfusion received:30440034}  Physical exam  Vitals:   03/21/23 2301 03/21/23 2330 03/21/23 2335 03/22/23 0004  BP: 114/83 119/74  114/75  Pulse: 78 63  85  Resp:      Temp:    99.4 F (37.4 C)   TempSrc:      SpO2: 100% 100%  100%  Weight:      Height:       General: {Exam; general:21111117} Lochia: {Desc; appropriate/inappropriate:30686::"appropriate"} Uterine Fundus: {Desc; firm/soft:30687} Incision: {Exam; incision:21111123} DVT Evaluation: {Exam; dvt:2111122} Labs: Lab Results  Component Value Date   WBC 11.3 (H) 03/21/2023   HGB 12.9 03/21/2023   HCT 38.4 03/21/2023   MCV 88.1 03/21/2023   PLT 194 03/21/2023      Latest Ref Rng & Units 03/21/2023    7:48 AM  CMP  Glucose 70 - 99 mg/dL 77   BUN 6 - 20 mg/dL 8   Creatinine 7.82 - 9.56 mg/dL 2.13   Sodium 086 - 578 mmol/L 135   Potassium 3.5 - 5.1 mmol/L 3.6   Chloride 98 - 111 mmol/L 105   CO2 22 - 32 mmol/L 20   Calcium 8.9 - 10.3 mg/dL 8.8   Total Protein 6.5 - 8.1 g/dL 6.6   Total Bilirubin 0.3 - 1.2 mg/dL 0.7   Alkaline Phos 38 - 126 U/L 152   AST 15 - 41 U/L 25   ALT 0 - 44 U/L 14    Edinburgh Score:     No data to display           After visit meds:  Allergies as of 03/22/2023   No Known Allergies   Med Rec must be completed prior to using this [facility]***        Discharge home in stable condition Infant Feeding: {Baby  feeding:23562} Infant Disposition:{CHL IP OB HOME WITH ZHYQMV:78469} Discharge instruction: per After Visit Summary and Postpartum booklet. Activity: Advance as tolerated. Pelvic rest for 6 weeks.  Diet: {OB GEXB:28413244} Future Appointments: Future Appointments  Date Time Provider Department Center  05/07/2023  2:35 PM Jerene Bears, MD [facility]  06/19/2023  8:10 AM Shamleffer, Konrad Dolores, MD [facility] None   Follow up Visit: Message sent to [facility] 5/18  Please schedule this patient for a In person postpartum visit in 6 weeks with the following provider: Any provider. Additional Postpartum F/U:BP check 1 week and TSH level recheck in  4-6 weeks High risk pregnancy complicated by: HTN and hypothyroidism Delivery mode:  Vaginal,  Spontaneous  Anticipated Birth Control:   declined   03/22/2023 Myrtie Hawk, DO

## 2023-03-22 NOTE — Lactation Note (Addendum)
This note was copied from a baby's chart. Lactation Consultation Note  Patient Name: Joann Fleming [id number] Date: 03/22/2023 Age:27 hours Reason for consult: L&D Initial assessment;1st time breastfeeding;Early term 37-38.6wks;Maternal endocrine disorder (Infant is less than 5 lbs 8 ounces will Follow LPTI/ LBW feeding guideliness).  Birth Parent done 1 hour of skin to skin. Birth Parent latched infant on her left breast using the cradle hold position, infant breastfeed for 8 minutes and was tiring at the breast, afterwards infant given 10 mls of 22 kcal formula pace fed by Support Person and LC. Birth parent will continue to BF infant 8+ times within 24 hours, or every 3 hours will follow up with RN on MBU regarding infant feeding guidelines. Birth Parent will continue to do lots of skin to skin with infant. LC services will follow up with Birth Parent in the morning.   Maternal Data Has patient been taught Hand Expression?: No Does the patient have breastfeeding experience prior to this delivery?: No  Feeding Mother's Current Feeding Choice: Breast Milk and Formula Nipple Type: Extra Slow Flow  LATCH Score Latch: Grasps breast easily, tongue down, lips flanged, rhythmical sucking.  Audible Swallowing: A few with stimulation  Type of Nipple: Everted at rest and after stimulation  Comfort (Breast/Nipple): Soft / non-tender  Hold (Positioning): Assistance needed to correctly position infant at breast and maintain latch.  LATCH Score: 8   Lactation Tools Discussed/Used    Interventions Interventions: Support pillows;Position options;Skin to skin;Education;Assisted with latch;Pace feeding  Discharge    Consult Status Consult Status: Follow-up from L&D    Frederico Hamman 03/22/2023, 2:40 AM

## 2023-03-22 NOTE — Anesthesia Postprocedure Evaluation (Signed)
Anesthesia Post Note  Patient: Joann Fleming  Procedure(s) Performed: AN AD HOC LABOR EPIDURAL     Patient location during evaluation: Mother Baby Anesthesia Type: Epidural Level of consciousness: awake, oriented and awake and alert Pain management: pain level controlled Vital Signs Assessment: post-procedure vital signs reviewed and stable Respiratory status: spontaneous breathing, respiratory function stable and nonlabored ventilation Cardiovascular status: stable Postop Assessment: no headache, adequate PO intake, able to ambulate, patient able to bend at knees and no apparent nausea or vomiting Anesthetic complications: no   No notable events documented.  Last Vitals:  Vitals:   03/22/23 0434 03/22/23 0833  BP: (!) 137/93 123/84  Pulse: 62 67  Resp: 18   Temp: 36.8 C   SpO2: 99%     Last Pain:  Vitals:   03/22/23 0833  TempSrc:   PainSc: 0-No pain   Pain Goal:                   Hollye Pritt

## 2023-03-23 NOTE — Progress Notes (Signed)
POSTPARTUM PROGRESS NOTE  Post Partum Day 1  Subjective:  Joann Fleming is a 27 y.o. G1P1001 s/p VD at [redacted]w[redacted]d.  She reports she is doing well. No acute events overnight. She denies any problems with ambulating, voiding or po intake. Denies nausea or vomiting.  Pain is well controlled.  Lochia is adequate.  Objective: Blood pressure 118/68, pulse 62, temperature 97.7 F (36.5 C), temperature source Oral, resp. rate 16, height 5\' 3"  (1.6 m), weight 93.7 kg, last menstrual period 07/05/2022, SpO2 100 %, unknown if currently breastfeeding.  Physical Exam:  General: alert, cooperative and no distress Chest: no respiratory distress Heart:regular rate, distal pulses intact Uterine Fundus: firm, appropriately tender DVT Evaluation: No calf swelling or tenderness Extremities: no edema Skin: warm, dry  Recent Labs    03/22/23 0127 03/22/23 0526  HGB 12.8 11.9*  HCT 38.0 34.5*    Assessment/Plan: Joann Fleming is a 27 y.o. G1P1001 s/p VD at [redacted]w[redacted]d   PPD#1 - Doing well  Routine postpartum care  Lactation consulted Contraception: declined Feeding: breast/bottle Dispo: Plan for discharge tomorrow.   LOS: 2 days   Myrtie Hawk, DO OB Fellow  03/23/2023, 8:13 AM

## 2023-03-23 NOTE — Lactation Note (Signed)
This note was copied from a baby's chart. Lactation Consultation Note  Patient Name: Joann Fleming [id number] Date: 03/23/2023 Age:27 hours Reason for consult: Follow-up assessment;Early term 37-38.6wks;Infant < 6lbs;Infant weight loss  Follow up visit to ET infant at [facility]. Support parent is holding infant upon arrival. Lactating parent reports good feedings with formula using recommended nipple. LP states she has been pumping for stimulation every 3h but unable to see colostrum. LC reviewed flanges and fitted 21-mm flanges. Reinforced pumping at least 8-times in 24h. Encouraged lots of skin to skin, monitoring voids and stools, follow crib card recommendations.    Feeding plan:  1-Feeding every 3h. 2-Preserve infant energy limiting feeding sessions to 30 min max.  3-Pump every 3h 4-Encouraged paternal rest, hydration and food intake.   All questions answered at this time. Contact LC as needed for feeds/support/concerns/questions   Maternal Data Has patient been taught Hand Expression?: Yes  Feeding Mother's Current Feeding Choice: Breast Milk and Formula Nipple Type: Nfant Standard Flow (white)  LATCH Score No latch observed during this encounter.   Lactation Tools Discussed/Used Tools: Pump;Flanges Flange Size: 21 Breast pump type: Double-Electric Breast Pump;Manual Reason for Pumping: ETI/LBW Pumping frequency: every 3h Pumped volume:  (unable to see colostrum)  Interventions Interventions: DEBP;Hand pump;Expressed milk;Education;Hand express;Breast massage;Skin to skin;Breast feeding basics reviewed  Discharge Pump: Personal;DEBP;Manual WIC Program: Yes  Consult Status Consult Status: Follow-up Date: 03/24/23 Follow-up type: In-patient    Justus Droke A Higuera Ancidey 03/23/2023, 1:54 PM

## 2023-03-24 ENCOUNTER — Encounter (HOSPITAL_BASED_OUTPATIENT_CLINIC_OR_DEPARTMENT_OTHER): Payer: Self-pay | Admitting: Advanced Practice Midwife

## 2023-03-24 ENCOUNTER — Other Ambulatory Visit (HOSPITAL_COMMUNITY): Payer: Self-pay

## 2023-03-24 DIAGNOSIS — O99820 Streptococcus B carrier state complicating pregnancy: Secondary | ICD-10-CM | POA: Insufficient documentation

## 2023-03-24 MED ORDER — NIFEDIPINE ER OSMOTIC RELEASE 30 MG PO TB24
30.0000 mg | ORAL_TABLET | Freq: Every day | ORAL | Status: DC
  Administered 2023-03-24: 30 mg via ORAL
  Filled 2023-03-24: qty 1

## 2023-03-24 MED ORDER — NIFEDIPINE ER 30 MG PO TB24
30.0000 mg | ORAL_TABLET | Freq: Every day | ORAL | 0 refills | Status: DC
  Filled 2023-03-24: qty 30, 30d supply, fill #0

## 2023-03-24 MED ORDER — LEVOTHYROXINE SODIUM 100 MCG PO TABS
100.0000 ug | ORAL_TABLET | Freq: Every day | ORAL | 0 refills | Status: DC
  Filled 2023-03-24: qty 30, 30d supply, fill #0

## 2023-03-24 MED ORDER — FUROSEMIDE 20 MG PO TABS
20.0000 mg | ORAL_TABLET | Freq: Every day | ORAL | 0 refills | Status: DC
  Filled 2023-03-24: qty 5, 5d supply, fill #0

## 2023-03-24 MED ORDER — IBUPROFEN 600 MG PO TABS
600.0000 mg | ORAL_TABLET | Freq: Four times a day (QID) | ORAL | 0 refills | Status: DC
  Filled 2023-03-24: qty 30, 8d supply, fill #0

## 2023-03-24 MED ORDER — ACETAMINOPHEN 325 MG PO TABS
650.0000 mg | ORAL_TABLET | ORAL | 0 refills | Status: DC | PRN
  Filled 2023-03-24: qty 100, 9d supply, fill #0

## 2023-03-24 NOTE — Discharge Summary (Signed)
Postpartum Discharge Summary      Patient Name: Joann Fleming DOB: [birth year] MRN: [id number]  Date of admission: 03/21/2023 Delivery date:03/22/2023  Delivering provider: Myrtie Hawk  Date of discharge: 03/24/2023  Admitting diagnosis: Gestational hypertension affecting third pregnancy [O13.9] Intrauterine pregnancy: [redacted]w[redacted]d     Secondary diagnosis:  Principal Problem:   Vaginal delivery Active Problems:   Hypothyroidism   Hashimoto's disease   Encounter for prenatal care of first pregnancy, third trimester   Rubella non-immune status, antepartum   Obesity affecting pregnancy   Gestational hypertension affecting third pregnancy  Additional problems: None    Discharge diagnosis: Term Pregnancy Delivered and Gestational Hypertension                                              Post partum procedures: None Augmentation: AROM and Cytotec Complications: None  Hospital course: Induction of Labor With Vaginal Delivery   27 y.o. yo G1P0 at [redacted]w[redacted]d was admitted to the hospital 03/21/2023 for induction of labor.  Indication for induction: Gestational hypertension.  Patient had an labor course complicated by none Membrane Rupture Time/Date: 5:18 PM ,03/21/2023   Delivery Method:Vaginal, Spontaneous  Episiotomy: None  Lacerations:  Sulcus  Details of delivery can be found in separate delivery note.  Patient had a uncomplicated postpartum course. Patient is discharged home 03/24/23.  Newborn Data: Birth date:03/22/2023  Birth time:12:13 AM  Gender:Female  Living status:Living  Apgars:8 ,9  Weight:2440 g   Magnesium Sulfate received: No BMZ received: No Rhophylac:N/A MMR:Yes  T-DaP:Given prenatally Flu: No Transfusion:No  Physical exam  Vitals:   03/24/23 1024 03/24/23 1122 03/24/23 1340 03/24/23 1958  BP: (!) 136/95 135/89 131/85 126/74  Pulse: 64 70 65 72  Resp: 18  18 18   Temp: 97.8 F (36.6 C)  99 F (37.2 C) 98.7 F (37.1 C)  TempSrc: Oral  Oral Oral   SpO2: 100% 100% 100% 98%  Weight:      Height:       General: alert, cooperative, and no distress Lochia: appropriate Uterine Fundus: firm Incision: N/A DVT Evaluation: No evidence of DVT seen on physical exam. Labs: Lab Results  Component Value Date   WBC 14.5 (H) 03/22/2023   HGB 11.9 (L) 03/22/2023   HCT 34.5 (L) 03/22/2023   MCV 85.4 03/22/2023   PLT 160 03/22/2023      Latest Ref Rng & Units 03/21/2023    7:48 AM  CMP  Glucose 70 - 99 mg/dL 77   BUN 6 - 20 mg/dL 8   Creatinine 5.36 - 6.44 mg/dL 0.34   Sodium 742 - 595 mmol/L 135   Potassium 3.5 - 5.1 mmol/L 3.6   Chloride 98 - 111 mmol/L 105   CO2 22 - 32 mmol/L 20   Calcium 8.9 - 10.3 mg/dL 8.8   Total Protein 6.5 - 8.1 g/dL 6.6   Total Bilirubin 0.3 - 1.2 mg/dL 0.7   Alkaline Phos 38 - 126 U/L 152   AST 15 - 41 U/L 25   ALT 0 - 44 U/L 14    Edinburgh Score:    03/22/2023    4:34 AM  Edinburgh Postnatal Depression Scale Screening Tool  I have been able to laugh and see the funny side of things. 0  I have looked forward with enjoyment to things. 0  I have blamed myself unnecessarily when things  went wrong. 0  I have been anxious or worried for no good reason. 0  I have felt scared or panicky for no good reason. 0  Things have been getting on top of me. 0  I have been so unhappy that I have had difficulty sleeping. 0  I have felt sad or miserable. 0  I have been so unhappy that I have been crying. 0  The thought of harming myself has occurred to me. 0  Edinburgh Postnatal Depression Scale Total 0     After visit meds:  Allergies as of 03/24/2023   No Known Allergies      Medication List     TAKE these medications    acetaminophen 325 MG tablet Commonly known as: Tylenol Take 2 tablets (650 mg total) by mouth every 4 (four) hours as needed (for pain scale < 4).   furosemide 20 MG tablet Commonly known as: Lasix Take 1 tablet (20 mg total) by mouth daily.   GoodSense Prenatal Vitamins 28-0.8  MG Tabs Take 1 tablet by mouth daily. Please fill with prenatal covered by insurance   ibuprofen 600 MG tablet Commonly known as: ADVIL Take 1 tablet (600 mg total) by mouth every 6 (six) hours.   levothyroxine 100 MCG tablet Commonly known as: SYNTHROID Take 1 tablet (100 mcg total) by mouth daily at 6 (six) AM. Start taking on: Mar 25, 2023 What changed:  medication strength how much to take when to take this   NIFEdipine 30 MG 24 hr tablet Commonly known as: ADALAT CC Take 1 tablet (30 mg total) by mouth daily. Start taking on: Mar 25, 2023         Discharge home in stable condition Infant Feeding: Breast Infant Disposition:home with mother Discharge instruction: per After Visit Summary and Postpartum booklet. Activity: Advance as tolerated. Pelvic rest for 6 weeks.  Diet: routine diet Future Appointments: Future Appointments  Date Time Provider Department Center  04/01/2023  2:00 PM [facility] NURSE [facility]  05/07/2023  2:35 PM Jerene Bears, MD [facility]  06/19/2023  8:10 AM Shamleffer, Konrad Dolores, MD [facility] None   Follow up Visit: Message sent to [facility] 5/18  Please schedule this patient for a In person postpartum visit in 6 weeks with the following provider: Any provider. Additional Postpartum F/U:BP check 1 week and TSH level recheck in  4-6 weeks High risk pregnancy complicated by: HTN and hypothyroidism Delivery mode:  Vaginal, Spontaneous  Anticipated Birth Control:   declined   03/24/2023 Celedonio Savage, MD

## 2023-03-24 NOTE — Lactation Note (Signed)
This note was copied from a baby's chart. Lactation Consultation Note  Patient Name: Joann Fleming [id number] Date: 03/24/2023 Age:27 hours  Reason for consult: Follow-up assessment  P1, GA [redacted]w[redacted]d, 4% weight loss, less than 6 lbs  Mother states she is pumping every 3 hours and has expressed a few drops that she feeds to baby. Mother is formula feeding due to early term and poor latch. She is also concerned that she does not have milk yet. Discussed the benefits of colostrum and how milk transitions.  Encouraged skin to skin, call for assistance with latching and praised for pumping. Discussed OP Lactation Support and feeding evaluation/ support after discharge to assist with transitioning baby to breast.    Feeding Mother's Current Feeding Choice: Breast Milk and Formula Nipple Type: Nfant Slow Flow (purple) (switched from newborn flow via SLP)    Lactation Tools Discussed/Used  DEBP  Interventions Interventions: Education  Discharge Management of breast engorgement, OP lactation services    Consult Status Consult Status: Follow-up Date: 03/25/23 Follow-up type: In-patient    Christella Hartigan M 03/24/2023, 7:06 PM

## 2023-03-25 ENCOUNTER — Other Ambulatory Visit (HOSPITAL_COMMUNITY): Payer: Self-pay

## 2023-03-25 ENCOUNTER — Ambulatory Visit (HOSPITAL_COMMUNITY): Payer: Self-pay

## 2023-03-25 NOTE — Lactation Note (Signed)
This note was copied from a baby's chart. Lactation Consultation Note  Patient Name: Boy Alfie Svetlik ZOXWR'U Date: 03/25/2023 Age:27 days Reason for consult: Follow-up assessment  P1, Baby [redacted]w[redacted]d.  Mother is now pumping 10 ml (5 ml per side).  Praised her for her efforts.  Encouraged her to continue pumping q 3 hours.  No further questions at this time.   Maternal Data Does the patient have breastfeeding experience prior to this delivery?: No  Feeding Mother's Current Feeding Choice: Breast Milk and Formula  Lactation Tools Discussed/Used Tools: Pump Breast pump type: Double-Electric Breast Pump;Manual Pump Education: Milk Storage Pumped volume: 10 mL  Interventions Interventions: Education;DEBP  Discharge Discharge Education: Engorgement and breast care;Warning signs for feeding baby  Consult Status Consult Status: Complete Date: 03/25/23    Dahlia Byes [facility] 03/25/2023, 11:20 AM

## 2023-03-26 ENCOUNTER — Encounter (HOSPITAL_BASED_OUTPATIENT_CLINIC_OR_DEPARTMENT_OTHER): Payer: Self-pay | Admitting: Obstetrics & Gynecology

## 2023-03-27 ENCOUNTER — Ambulatory Visit: Payer: No Typology Code available for payment source

## 2023-03-29 ENCOUNTER — Telehealth (HOSPITAL_COMMUNITY): Payer: Self-pay

## 2023-03-29 NOTE — Telephone Encounter (Signed)
Patient did not answer phone call. Voicemail left for patient.   Suann Larry [facility]   03/29/23,1614

## 2023-04-01 ENCOUNTER — Ambulatory Visit (HOSPITAL_BASED_OUTPATIENT_CLINIC_OR_DEPARTMENT_OTHER): Payer: BLUE CROSS/BLUE SHIELD

## 2023-04-01 ENCOUNTER — Encounter (HOSPITAL_BASED_OUTPATIENT_CLINIC_OR_DEPARTMENT_OTHER): Payer: Self-pay

## 2023-04-01 VITALS — BP 123/89 | HR 54 | Ht 63.0 in | Wt 190.6 lb

## 2023-04-01 DIAGNOSIS — Z0130 Encounter for examination of blood pressure without abnormal findings: Secondary | ICD-10-CM

## 2023-04-02 ENCOUNTER — Encounter (HOSPITAL_BASED_OUTPATIENT_CLINIC_OR_DEPARTMENT_OTHER): Payer: BLUE CROSS/BLUE SHIELD | Admitting: Medical

## 2023-04-02 ENCOUNTER — Encounter (HOSPITAL_BASED_OUTPATIENT_CLINIC_OR_DEPARTMENT_OTHER): Payer: Self-pay | Admitting: Obstetrics & Gynecology

## 2023-04-07 ENCOUNTER — Encounter (HOSPITAL_BASED_OUTPATIENT_CLINIC_OR_DEPARTMENT_OTHER): Payer: Self-pay | Admitting: Obstetrics & Gynecology

## 2023-04-25 ENCOUNTER — Other Ambulatory Visit: Payer: Self-pay | Admitting: Internal Medicine

## 2023-05-07 ENCOUNTER — Ambulatory Visit (HOSPITAL_BASED_OUTPATIENT_CLINIC_OR_DEPARTMENT_OTHER): Payer: BLUE CROSS/BLUE SHIELD | Admitting: Obstetrics & Gynecology

## 2023-05-07 ENCOUNTER — Encounter (HOSPITAL_BASED_OUTPATIENT_CLINIC_OR_DEPARTMENT_OTHER): Payer: Self-pay | Admitting: Obstetrics & Gynecology

## 2023-05-07 ENCOUNTER — Other Ambulatory Visit (HOSPITAL_COMMUNITY)
Admission: RE | Admit: 2023-05-07 | Discharge: 2023-05-07 | Disposition: A | Discharge status: Home / Self Care | Payer: BLUE CROSS/BLUE SHIELD | Source: Ambulatory Visit | Attending: Obstetrics & Gynecology | Admitting: Obstetrics & Gynecology

## 2023-05-07 DIAGNOSIS — Z124 Encounter for screening for malignant neoplasm of cervix: Secondary | ICD-10-CM | POA: Diagnosis not present

## 2023-05-07 DIAGNOSIS — R102 Pelvic and perineal pain: Secondary | ICD-10-CM

## 2023-05-07 DIAGNOSIS — B372 Candidiasis of skin and nail: Secondary | ICD-10-CM

## 2023-05-07 DIAGNOSIS — Z30011 Encounter for initial prescription of contraceptive pills: Secondary | ICD-10-CM | POA: Diagnosis not present

## 2023-05-07 MED ORDER — GOODSENSE PRENATAL VITAMINS 28-0.8 MG PO TABS: 1.0000 | ORAL_TABLET | Freq: Every day | ORAL | 11 refills | Status: DC

## 2023-05-07 MED ORDER — NORETHINDRONE 0.35 MG PO TABS
1.0000 | ORAL_TABLET | Freq: Every day | ORAL | 3 refills | Status: DC
Start: 2023-05-07 — End: 2024-02-24

## 2023-05-07 MED ORDER — NYSTATIN 100000 UNIT/GM EX CREA
1.0000 | TOPICAL_CREAM | Freq: Two times a day (BID) | CUTANEOUS | 0 refills | Status: DC
Start: 2023-05-07 — End: 2024-02-24

## 2023-05-07 NOTE — Progress Notes (Addendum)
Post Partum Visit Note  Joann Fleming is a 27 y.o. G54P1001 female who presents for a postpartum visit. She is 6 weeks postpartum following a normal spontaneous vaginal delivery.  I have fully reviewed the prenatal and intrapartum course. The delivery was at 37 1/7 gestational weeks.  Anesthesia: epidural. Postpartum course has been good. Baby is doing well. Baby is feeding by both breast and bottle - Similac Neosure. Bleeding  has stopped but she thinks she is on her cycle now . Bowel function is normal. Bladder function is normal.  Patient is not sexually active. Contraception method is oral progesterone-only contraceptive. Postpartum depression screening: negative.   The pregnancy intention screening data noted above was reviewed. Potential methods of contraception were discussed. The patient elected to proceed with POPs.    Health Maintenance Due  Topic Date Due   COVID-19 Vaccine (1) Never done   HPV VACCINES (1 - 2-dose series) Never done   PAP-Cervical Cytology Screening  Never done   PAP SMEAR-Modifier  Never done    The following portions of the patient's history were reviewed and updated as appropriate: allergies, current medications, past family history, past medical history, past social history, past surgical history, and problem list.  Review of Systems Pertinent items noted in HPI and remainder of comprehensive ROS otherwise negative.  Objective:  BP 123/86   Pulse (!) 58   Ht 5\' 3"  (1.6 m)   Wt 193 lb 6.4 oz (87.7 kg)   LMP 04/25/2023 (Exact Date)   Breastfeeding Yes   BMI 34.26 kg/m    General:  alert, cooperative, and no distress   Breasts:  normal  Lungs: clear to auscultation bilaterally  Heart:  regular rate and rhythm, S1, S2 normal, no murmur, click, rub or gallop  Abdomen: soft, non-tender; bowel sounds normal; no masses,  no organomegaly   Wound N/a  GU exam:   NAFG, vaginal without lesions, no sutures, lacerations well healed.  Cervix normal.   Uterus normal.       Assessment:    1. Postpartum care and examination  2. Cervical cancer screening - Cytology - PAP( [facility])  3. Encounter for initial prescription of contraceptive pills - norethindrone (MICRONOR) 0.35 MG tablet; Take 1 tablet (0.35 mg total) by mouth daily.  Dispense: 84 tablet; Refill: 3  4. Candidal skin infection - nystatin cream (MYCOSTATIN); Apply 1 Application topically 2 (two) times daily. Apply to affected area BID for up to 7 days.  Dispense: 30 g; Refill: 0   Plan:   Essential components of care per ACOG recommendations:  1.  Mood and well being: Patient with negative depression screening today. Reviewed local resources for support.  - Patient tobacco use? No.   - hx of drug use? No.    2. Infant care and feeding:  -Patient currently breastmilk feeding?  -Social determinants of health (SDOH) reviewed in EPIC.   3. Sexuality, contraception and birth spacing - Patient does not want a pregnancy in the next year.  Desired family size is 1 children.  - Reviewed reproductive life planning. Reviewed contraceptive methods based on pt preferences and effectiveness.  Patient desired Oral Contraceptive today.  Will use POPs for now while still breast feeding.  - Discussed birth spacing of 18 months  4. Sleep and fatigue -Encouraged family/partner/community support of 4 hrs of uninterrupted sleep to help with mood and fatigue  5. Physical Recovery  - Discussed patients delivery and complications. She describes her labor as good. -  Patient had a Vaginal, no problems at delivery. Patient had a 2nd degree laceration. Perineal healing reviewed. Patient expressed understanding - Patient has urinary incontinence? No. - Patient is safe to resume physical and sexual activity  6.  Health Maintenance - HM due items addressed Yes.  She is going to continue to monitor blood pressures.  She checked them regularly the first two weeks after delivery.   - Pap smear  done at today's visit.  -Breast Cancer screening indicated? No.   7. Chronic Disease/Pregnancy Condition follow up: hypothyroidism.  Has follow with Dr. Lonzo Cloud in August.    Rubella non immune.  Counseled about vaccination.  Will let us know if desires to have this done.   Jerene Bears, MD Center for [employer], [facility]

## 2023-05-13 LAB — CYTOLOGY - PAP
Diagnosis: NEGATIVE
Diagnosis: REACTIVE

## 2023-05-22 NOTE — Progress Notes (Signed)
Patient came in for Postpartum blood pressure check. Blood pressure was normal and will be evaluated at next office visit. tbw

## 2023-06-18 NOTE — Progress Notes (Unsigned)
Name: Joann Fleming  MRN/ DOB: [id number], 07-Aug-1996    Age/ Sex: 27 y.o., female     PCP:  Barry Brunner   Reason for Endocrinology Evaluation: Hypothyroidism     Initial Endocrinology Clinic Visit: 09/04/2018    PATIENT IDENTIFIER: Joann Fleming is a 27 y.o., female with unremarkable past medical history . She has followed with [facility] since 09/04/2018 for consultative assistance with management of her Hypothyroidism.   HISTORICAL SUMMARY:   In 2018, during work up for severe headaches, she was found to have elevated TSH of 454 uIU/mL . At the time she was euthyroid without any c/o of weight gain, fatigue, constipation or dry skin. Her menses have always been regular. On repeat TSH here in our office (09/04/2018) her TSH was 4.95 uIU/mL  with normal prolactin level.   Her TSH increased to 18.46 uIU/mL in 12/2019 , we attempted to start LT-4 but did not get the message , repeat labs in 12/2020 TSH went down to 3.98 uIU/mL and we opted to hold off on any LT-4 replacement but this started 04/2021 with a TSH 21.22 uIU/mL    SUBJECTIVE:    Today (06/18/2023):  Joann Fleming is here for a follow up on Hashimoto's disease.  She is s/p vaginal delivery 03/2023   Weight continues to fluctuate  Denies constipation  Denies nausea Denies local neck symptoms  Denies palpitations      Levothyroxine 112 mcg daily    HISTORY:  Past Medical History:  Past Medical History:  Diagnosis Date   Abnormal TSH 2018   Anemia    History of group B Streptococcus (GBS) infection    positive with pregnancy at 36 weeks   Hypothyroidism    Past Surgical History: Abnormal TSH  Social History:  reports that she has never smoked. She has never used smokeless tobacco. She reports that she does not currently use alcohol. She reports that she does not use drugs. Family History: family history includes Diabetes in her mother; Diabetes type II in her mother.   HOME  MEDICATIONS: Allergies as of 06/19/2023   No Known Allergies      Medication List        Accurate as of June 18, 2023 10:52 AM. If you have any questions, ask your nurse or doctor.          GoodSense Prenatal Vitamins 28-0.8 MG Tabs Take 1 tablet by mouth daily. Please fill with prenatal covered by insurance   levothyroxine 100 MCG tablet Commonly known as: SYNTHROID Take 1 tablet (100 mcg total) by mouth daily at 6 (six) AM.   norethindrone 0.35 MG tablet Commonly known as: MICRONOR Take 1 tablet (0.35 mg total) by mouth daily.   nystatin cream Commonly known as: MYCOSTATIN Apply 1 Application topically 2 (two) times daily. Apply to affected area BID for up to 7 days.          OBJECTIVE:   PHYSICAL EXAM: VS: There were no vitals taken for this visit.   EXAM: General: Pt appears well and is in NAD  Neck: General: Supple without adenopathy. Thyroid: Thyroid size normal.  No goiter or nodules appreciated. No thyroid bruit.  Lungs: Clear with good BS bilat with no rales, rhonchi, or wheezes  Heart: Auscultation: RRR.  Abdomen: Normoactive bowel sounds, soft, nontender, without masses or organomegaly palpable  Mental Status: Judgment, insight: Intact Orientation: Oriented to time, place, and person Mood and affect: No depression, anxiety, or agitation  DATA REVIEWED:  Latest Reference Range & Units 12/19/22 08:19  LH mIU/mL 0.37  FSH mIU/ML 0.8  Prolactin ng/mL 197.7 (H)  Estradiol pg/mL 8,984 (H)  Preg, Serum  POSITIVE !  TSH 0.35 - 5.50 uIU/mL 2.79     Results for LARAYA, ROBERTS (MRN [id number]) as of 12/10/2018 12:33  Ref. Range 12/07/2018 08:27  Thyroperoxidase Ab SerPl-aCnc Latest Ref Range: <9 IU/mL 410 (H)    ASSESSMENT / PLAN / RECOMMENDATIONS:   1.  Hashimoto's Thyroiditis :   - Pt is clinically euthyroid - No local neck symptoms. -TSH is normal, but since she is pregnant, will increase levothyroxine due to increased levothyroxine  requirement -Repeat labs in 6 weeks  Medication STOP  levothyroxine 88 mcg daily Start Levothyroxine 112 mcg daily      F/U in 6 months     Signed electronically by: Lyndle Herrlich, MD  [facility] Endocrinology  [facility] [address] [place] [place], [place] [address] Phone: [phone] FAX: [phone]      CC: Barry Brunner [address] [place], [place] [address] Phone: [phone] Fax: [phone]  Return to Endocrinology clinic as below: Future Appointments  Date Time Provider Department Center  06/19/2023  8:10 AM Khari Lett, Konrad Dolores, MD [facility] None

## 2023-06-19 ENCOUNTER — Telehealth: Payer: Self-pay | Admitting: Internal Medicine

## 2023-06-19 ENCOUNTER — Encounter: Payer: Self-pay | Admitting: Internal Medicine

## 2023-06-19 ENCOUNTER — Ambulatory Visit: Payer: BLUE CROSS/BLUE SHIELD | Admitting: Internal Medicine

## 2023-06-19 VITALS — BP 122/74 | HR 69 | Ht 63.0 in | Wt 196.0 lb

## 2023-06-19 DIAGNOSIS — E063 Autoimmune thyroiditis: Secondary | ICD-10-CM | POA: Diagnosis not present

## 2023-06-19 LAB — T4, FREE: Free T4: 0.88 ng/dL (ref 0.60–1.60)

## 2023-06-19 LAB — TSH: TSH: 8.29 u[IU]/mL — ABNORMAL HIGH (ref 0.35–5.50)

## 2023-06-19 MED ORDER — LEVOTHYROXINE SODIUM 100 MCG PO TABS: 100.0000 ug | ORAL_TABLET | Freq: Every day | ORAL | 3 refills | Status: DC

## 2023-06-19 NOTE — Telephone Encounter (Signed)
LMTCB and also sent mychart message 

## 2023-06-19 NOTE — Patient Instructions (Signed)

## 2023-06-19 NOTE — Telephone Encounter (Signed)
Please let the patient know that her thyroid test shows that she is NOT on enough levothyroxine, this explains the hair loss and the weight gain  Please asked the patient to stop levothyroxine 88 mcg and START  levothyroxine 100 mcg daily   Please schedule the patient for repeat labs in 2 months    Thanks

## 2023-06-20 NOTE — Telephone Encounter (Signed)
Patient has viewed message through [employer]. Left another message to call and schedule lab appointment

## 2023-12-22 ENCOUNTER — Encounter: Payer: Self-pay | Admitting: Internal Medicine

## 2023-12-22 ENCOUNTER — Ambulatory Visit (INDEPENDENT_AMBULATORY_CARE_PROVIDER_SITE_OTHER): Payer: Medicaid Other | Admitting: Internal Medicine

## 2023-12-22 VITALS — BP 110/70 | HR 79 | Ht 63.0 in | Wt 204.0 lb

## 2023-12-22 DIAGNOSIS — R635 Abnormal weight gain: Secondary | ICD-10-CM | POA: Diagnosis not present

## 2023-12-22 DIAGNOSIS — E063 Autoimmune thyroiditis: Secondary | ICD-10-CM

## 2023-12-22 LAB — T4, FREE: Free T4: 0.9 ng/dL (ref 0.8–1.8)

## 2023-12-22 LAB — TSH: TSH: 9.69 m[IU]/L — ABNORMAL HIGH

## 2023-12-22 NOTE — Progress Notes (Unsigned)
 Name: Joann Fleming  MRN/ DOB: [id number], 10-28-1996    Age/ Sex: 28 y.o., female     PCP:  Barry Brunner   Reason for Endocrinology Evaluation: Hypothyroidism     Initial Endocrinology Clinic Visit: 09/04/2018    PATIENT IDENTIFIER: Joann Fleming is a 28 y.o., female with unremarkable past medical history . She has followed with [facility] since 09/04/2018 for consultative assistance with management of her Hypothyroidism.   HISTORICAL SUMMARY:   In 2018, during work up for severe headaches, she was found to have elevated TSH of 454 uIU/mL . At the time she was euthyroid without any c/o of weight gain, fatigue, constipation or dry skin. Her menses have always been regular. On repeat TSH here in our office (09/04/2018) her TSH was 4.95 uIU/mL  with normal prolactin level.   Her TSH increased to 18.46 uIU/mL in 12/2019 , we attempted to start LT-4 but did not get the message , repeat labs in 12/2020 TSH went down to 3.98 uIU/mL and we opted to hold off on any LT-4 replacement but this started 04/2021 with a TSH 21.22 uIU/mL    S/P NVD 03/2023 - Warden Fillers   SUBJECTIVE:    Today (12/22/2023):  Joann Fleming is here for a follow up on Hashimoto's disease.   Patient continues weight gain She did not have her labs to be done in 2 months following last visit Denies local neck swelling  Denies palpitations  Denies tremors Denies constipation or diarrhea  She is on Micronor   She does exercise on regular but continues to gain weight    Levothyroxine 100 mcg daily    HISTORY:  Past Medical History:  Past Medical History:  Diagnosis Date   Abnormal TSH 2018   Anemia    History of group B Streptococcus (GBS) infection    positive with pregnancy at 36 weeks   Hypothyroidism    Past Surgical History: Abnormal TSH  Social History:  reports that she has never smoked. She has never used smokeless tobacco. She reports that she does not currently use alcohol.  She reports that she does not use drugs. Family History: family history includes Diabetes in her mother; Diabetes type II in her mother.   HOME MEDICATIONS: Allergies as of 12/22/2023   No Known Allergies      Medication List        Accurate as of December 22, 2023  7:46 AM. If you have any questions, ask your nurse or doctor.          GoodSense Prenatal Vitamins 28-0.8 MG Tabs Take 1 tablet by mouth daily. Please fill with prenatal covered by insurance   levothyroxine 100 MCG tablet Commonly known as: SYNTHROID Take 1 tablet (100 mcg total) by mouth daily.   norethindrone 0.35 MG tablet Commonly known as: MICRONOR Take 1 tablet (0.35 mg total) by mouth daily.   nystatin cream Commonly known as: MYCOSTATIN Apply 1 Application topically 2 (two) times daily. Apply to affected area BID for up to 7 days.          OBJECTIVE:   PHYSICAL EXAM: VS: BP 110/70 (BP Location: Left Arm, Patient Position: Sitting, Cuff Size: Normal)   Pulse 79   Ht 5\' 3"  (1.6 m)   Wt 204 lb (92.5 kg)   SpO2 98%   BMI 36.14 kg/m    EXAM: General: Pt appears well and is in NAD  Neck: General: Supple without adenopathy. Thyroid: Thyroid size  normal.  No goiter or nodules appreciated.  Lungs: Clear with good BS bilat   Heart: Auscultation: RRR.  Mental Status: Judgment, insight: Intact Orientation: Oriented to time, place, and person Mood and affect: No depression, anxiety, or agitation     DATA REVIEWED:    Latest Reference Range & Units 12/22/23 08:02  TSH mIU/L 9.69 (H)  T4,Free(Direct) 0.8 - 1.8 ng/dL 0.9  (H): Data is abnormally high   Results for YALENA, COLON (MRN [id number]) as of 12/10/2018 12:33  Ref. Range 12/07/2018 08:27  Thyroperoxidase Ab SerPl-aCnc Latest Ref Range: <9 IU/mL 410 (H)    ASSESSMENT / PLAN / RECOMMENDATIONS:   1.  Hashimoto's Thyroiditis :  -Patient continues with weight gain -No local neck symptoms. -Patient admits to imperfect adherence to  medications -I have strongly encouraged the patient to obtain a pillbox -TSH elevated, will increase levothyroxine as below  Medication  Stop levothyroxine 100 mcg daily Start levothyroxine 125 mcg daily   2. Weight gain:  -Patient states that she eats a healthy diet and exercises on a regular basis -I will refer her to [facility] -I did discuss the importance of compliance with levothyroxine intake and optimizing TSH level to prevent any further weight gain  F/U in 4 months     Signed electronically by: Lyndle Herrlich, MD  [facility]  [facility] [address] [place] [place], [place] [address] Phone: [phone] FAX: [phone]      CC: Barry Brunner [address] [place], [place] [address] Phone: [phone] Fax: [phone]  Return to Endocrinology clinic as below: No future appointments.

## 2023-12-22 NOTE — Patient Instructions (Signed)

## 2023-12-23 ENCOUNTER — Encounter: Payer: Self-pay | Admitting: Internal Medicine

## 2023-12-23 MED ORDER — LEVOTHYROXINE SODIUM 125 MCG PO TABS: 125.0000 ug | ORAL_TABLET | Freq: Every day | ORAL | 3 refills | Status: DC

## 2024-01-21 ENCOUNTER — Ambulatory Visit (INDEPENDENT_AMBULATORY_CARE_PROVIDER_SITE_OTHER): Payer: Medicaid Other | Admitting: Internal Medicine

## 2024-01-21 ENCOUNTER — Encounter (INDEPENDENT_AMBULATORY_CARE_PROVIDER_SITE_OTHER): Payer: Self-pay | Admitting: Internal Medicine

## 2024-01-21 VITALS — BP 112/68 | HR 67 | Temp 97.8°F | Ht 63.0 in | Wt 199.0 lb

## 2024-01-21 DIAGNOSIS — Z6835 Body mass index (BMI) 35.0-35.9, adult: Secondary | ICD-10-CM | POA: Diagnosis not present

## 2024-01-21 DIAGNOSIS — E66812 Obesity, class 2: Secondary | ICD-10-CM | POA: Diagnosis not present

## 2024-01-21 DIAGNOSIS — E063 Autoimmune thyroiditis: Secondary | ICD-10-CM

## 2024-01-21 DIAGNOSIS — E7800 Pure hypercholesterolemia, unspecified: Secondary | ICD-10-CM | POA: Insufficient documentation

## 2024-01-21 DIAGNOSIS — Z0289 Encounter for other administrative examinations: Secondary | ICD-10-CM

## 2024-01-21 DIAGNOSIS — Z8759 Personal history of other complications of pregnancy, childbirth and the puerperium: Secondary | ICD-10-CM | POA: Insufficient documentation

## 2024-01-21 DIAGNOSIS — Z833 Family history of diabetes mellitus: Secondary | ICD-10-CM | POA: Insufficient documentation

## 2024-01-21 NOTE — Progress Notes (Signed)
 Office: [phone]  /  Fax: [phone]   Initial Visit  Joann Fleming was seen in clinic today to evaluate for obesity. She is interested in losing weight to improve overall health and reduce the risk of weight related complications. She presents today to review program treatment options, initial physical assessment, and evaluation.     She was referred by: Specialist Dr. Gildardo Griffes  When asked what else they would like to accomplish? She states: Adopt healthier eating patterns and Improve energy levels and physical activity  When asked how has your weight affected you? She states: Having fatigue and Having poor endurance  Weight history: started to gain weight after diagnosis of hypothyroidism  Some associated conditions: None  Contributing factors: Family history of obesity and Consumption of processed foods  Weight promoting medications identified: None  Current nutrition plan: None  Current level of physical activity: Walking 30-60 minutes 7 days a week with her son  Current or previous pharmacotherapy: None  Response to medication: Never tried medications   Past medical history includes:   Past Medical History:  Diagnosis Date   Abnormal TSH 2018   Anemia    History of group B Streptococcus (GBS) infection    positive with pregnancy at 36 weeks   Hypothyroidism      Objective:   BP 112/68   Pulse 67   Temp 97.8 F (36.6 C)   Ht 5\' 3"  (1.6 m)   Wt 199 lb (90.3 kg)   SpO2 96%   BMI 35.25 kg/m  She was weighed on the bioimpedance scale: Body mass index is 35.25 kg/m.  Peak Weight:205 , Body Fat%:43, Visceral Fat Rating:8, Weight trend over the last 12 months: Unchanged  General:  Alert, oriented and cooperative. Patient is in no acute distress.  Respiratory: Normal respiratory effort, no problems with respiration noted   Gait: able to ambulate independently  Mental Status: Normal mood and affect. Normal behavior. Normal judgment and thought content.    DIAGNOSTIC DATA REVIEWED:  BMET    Component Value Date/Time   NA 135 03/21/2023 0748   NA 137 03/20/2023 1435   K 3.6 03/21/2023 0748   CL 105 03/21/2023 0748   CO2 20 (L) 03/21/2023 0748   GLUCOSE 77 03/21/2023 0748   BUN 8 03/21/2023 0748   BUN 10 03/20/2023 1435   CREATININE 0.74 03/21/2023 0748   CALCIUM 8.8 (L) 03/21/2023 0748   GFRNONAA >60 03/21/2023 0748   Lab Results  Component Value Date   HGBA1C 5.5 01/03/2023   No results found for: "INSULIN" CBC    Component Value Date/Time   WBC 14.5 (H) 03/22/2023 0526   RBC 4.04 03/22/2023 0526   HGB 11.9 (L) 03/22/2023 0526   HGB 12.7 03/20/2023 1435   HCT 34.5 (L) 03/22/2023 0526   HCT 37.8 03/20/2023 1435   PLT 160 03/22/2023 0526   PLT 201 03/20/2023 1435   MCV 85.4 03/22/2023 0526   MCV 89 03/20/2023 1435   MCH 29.5 03/22/2023 0526   MCHC 34.5 03/22/2023 0526   RDW 15.5 03/22/2023 0526   RDW 16.5 (H) 03/20/2023 1435   Iron/TIBC/Ferritin/ %Sat No results found for: "IRON", "TIBC", "FERRITIN", "IRONPCTSAT" Lipid Panel  No results found for: "CHOL", "TRIG", "HDL", "CHOLHDL", "VLDL", "LDLCALC", "LDLDIRECT" Hepatic Function Panel     Component Value Date/Time   PROT 6.6 03/21/2023 0748   PROT 6.8 03/20/2023 1435   ALBUMIN 2.9 (L) 03/21/2023 0748   ALBUMIN 4.0 03/20/2023 1435   AST 25 03/21/2023  0748   ALT 14 03/21/2023 0748   ALKPHOS 152 (H) 03/21/2023 0748   BILITOT 0.7 03/21/2023 0748   BILITOT 0.2 03/20/2023 1435      Component Value Date/Time   TSH 9.69 (H) 12/22/2023 0802     Assessment and Plan:   Hypothyroidism due to Hashimoto thyroiditis Assessment & Plan: She was diagnosed with Hashimoto's in 2019 she is now on levothyroxine she is followed by endocrinology.  She reports a gradual weight gain after her diagnosis of hypothyroidism.  Most recent TSH was 9.69 her levothyroxine is currently being adjusted.   Pure hypercholesterolemia Assessment & Plan: I reviewed labs at [facility] she had  an LDL just above 100 with normal triglycerides and HDL cholesterol.  She has family history of metabolic conditions, had hypertension induced by pregnancy so may be at higher risk for cardiovascular disease in the future.  Losing 10% of body weight may improve LDL cholesterol.  Also reducing saturated fats in her diet.  We will work on nutrition plan at the next office visit   History of pregnancy induced hypertension Assessment & Plan: Puts patient at risk for future cardiovascular risk.  Losing weight may reduce risk.  Her blood pressure is well-controlled.   Family history of diabetes mellitus Assessment & Plan: Patient will be screened for insulin resistance and prediabetes with intake labs.  She has family history of diabetes and has a BMI of 35.   Class 2 obesity without serious comorbidity with body mass index (BMI) of 35.0 to 35.9 in adult, unspecified obesity type Assessment & Plan: Patient presents with gradual weight gain after diagnosis of hypothyroidism.  She appears to be metabolically healthy but will be screened for metabolic derangements at intake labs.  We reviewed anthropometrics, biometrics, associated medical conditions and contributing factors with patient. she would benefit from a medically tailored reduced calorie nutrional plan based on her REE (resting energy expenditure), which will be determined by indirect calorimetry.  We will also assess for cardiometabolic risk and nutritional derangements via fasting labs at intake appointment.           Obesity Treatment / Action Plan:  Patient will work on garnering support from family and friends to begin weight loss journey. Will work on eliminating or reducing the presence of highly palatable, calorie dense foods in the home. Will complete provided nutritional and psychosocial assessment questionnaire before the next appointment. Will be scheduled for indirect calorimetry to determine resting energy expenditure in  a fasting state.  This will allow [place] to create a reduced calorie, high-protein meal plan to promote loss of fat mass while preserving muscle mass. Counseled on the health benefits of losing 5%-15% of total body weight. Was counseled on nutritional approaches to weight loss and benefits of reducing processed foods and consuming plant-based foods and high quality protein as part of nutritional weight management. Was counseled on pharmacotherapy and role as an adjunct in weight management.   Obesity Education Performed Today:  She was weighed on the bioimpedance scale and results were discussed and documented in the synopsis.  We discussed obesity as a disease and the importance of a more detailed evaluation of all the factors contributing to the disease.  We discussed the importance of long term lifestyle changes which include nutrition, exercise and behavioral modifications as well as the importance of customizing this to her specific health and social needs.  We discussed the benefits of reaching a healthier weight to alleviate the symptoms of existing conditions and reduce  the risks of the biomechanical, metabolic and psychological effects of obesity.  Delford Field appears to be in the action stage of change and states they are ready to start intensive lifestyle modifications and behavioral modifications.  40 minutes was spent today on this visit including the above counseling, pre-visit chart review, and post-visit documentation.  Reviewed by clinician on day of visit: allergies, medications, problem list, medical history, surgical history, family history, social history, and previous encounter notes pertinent to obesity diagnosis.   Worthy Rancher, MD

## 2024-01-21 NOTE — Assessment & Plan Note (Signed)
 Puts patient at risk for future cardiovascular risk.  Losing weight may reduce risk.  Her blood pressure is well-controlled.

## 2024-01-21 NOTE — Assessment & Plan Note (Signed)
 I reviewed labs at [facility] she had an LDL just above 100 with normal triglycerides and HDL cholesterol.  She has family history of metabolic conditions, had hypertension induced by pregnancy so may be at higher risk for cardiovascular disease in the future.  Losing 10% of body weight may improve LDL cholesterol.  Also reducing saturated fats in her diet.  We will work on nutrition plan at the next office visit

## 2024-01-21 NOTE — Assessment & Plan Note (Signed)
 She was diagnosed with Hashimoto's in 2019 she is now on levothyroxine she is followed by endocrinology.  She reports a gradual weight gain after her diagnosis of hypothyroidism.  Most recent TSH was 9.69 her levothyroxine is currently being adjusted.

## 2024-01-21 NOTE — Assessment & Plan Note (Signed)
 Patient will be screened for insulin resistance and prediabetes with intake labs.  She has family history of diabetes and has a BMI of 35.

## 2024-01-21 NOTE — Assessment & Plan Note (Signed)
 Patient presents with gradual weight gain after diagnosis of hypothyroidism.  She appears to be metabolically healthy but will be screened for metabolic derangements at intake labs.  We reviewed anthropometrics, biometrics, associated medical conditions and contributing factors with patient. she would benefit from a medically tailored reduced calorie nutrional plan based on her REE (resting energy expenditure), which will be determined by indirect calorimetry.  We will also assess for cardiometabolic risk and nutritional derangements via fasting labs at intake appointment.

## 2024-02-23 ENCOUNTER — Ambulatory Visit (INDEPENDENT_AMBULATORY_CARE_PROVIDER_SITE_OTHER): Admitting: Internal Medicine

## 2024-02-24 ENCOUNTER — Encounter (HOSPITAL_COMMUNITY): Admission: AD | Disposition: A | Discharge status: Home / Self Care | Payer: Self-pay | Source: Home / Self Care

## 2024-02-24 ENCOUNTER — Other Ambulatory Visit: Payer: Self-pay

## 2024-02-24 ENCOUNTER — Inpatient Hospital Stay (HOSPITAL_COMMUNITY): Admitting: Anesthesiology

## 2024-02-24 ENCOUNTER — Encounter (HOSPITAL_COMMUNITY): Payer: Self-pay

## 2024-02-24 ENCOUNTER — Inpatient Hospital Stay (HOSPITAL_BASED_OUTPATIENT_CLINIC_OR_DEPARTMENT_OTHER): Admitting: Anesthesiology

## 2024-02-24 ENCOUNTER — Inpatient Hospital Stay (HOSPITAL_COMMUNITY)

## 2024-02-24 ENCOUNTER — Observation Stay (HOSPITAL_COMMUNITY)
Admission: AD | Admit: 2024-02-24 | Discharge: 2024-02-26 | Disposition: A | Discharge status: Home / Self Care | Attending: Obstetrics and Gynecology | Admitting: Obstetrics and Gynecology

## 2024-02-24 DIAGNOSIS — E039 Hypothyroidism, unspecified: Secondary | ICD-10-CM | POA: Diagnosis not present

## 2024-02-24 DIAGNOSIS — O009 Unspecified ectopic pregnancy without intrauterine pregnancy: Secondary | ICD-10-CM

## 2024-02-24 DIAGNOSIS — Z3A00 Weeks of gestation of pregnancy not specified: Secondary | ICD-10-CM

## 2024-02-24 DIAGNOSIS — O00102 Left tubal pregnancy without intrauterine pregnancy: Principal | ICD-10-CM | POA: Insufficient documentation

## 2024-02-24 DIAGNOSIS — R21 Rash and other nonspecific skin eruption: Secondary | ICD-10-CM | POA: Insufficient documentation

## 2024-02-24 HISTORY — PX: DIAGNOSTIC LAPAROSCOPY WITH REMOVAL OF ECTOPIC PREGNANCY: SHX6449

## 2024-02-24 HISTORY — DX: Gestational (pregnancy-induced) hypertension without significant proteinuria, unspecified trimester: O13.9

## 2024-02-24 HISTORY — DX: Urinary tract infection, site not specified: N39.0

## 2024-02-24 HISTORY — DX: Dermatitis, unspecified: L30.9

## 2024-02-24 HISTORY — PX: LAPAROSCOPIC UNILATERAL SALPINGECTOMY: SHX5934

## 2024-02-24 LAB — CBC
HCT: 29.6 % — ABNORMAL LOW (ref 36.0–46.0)
HCT: 28.1 % — ABNORMAL LOW (ref 36.0–46.0)
HCT: 23.4 % — ABNORMAL LOW (ref 36.0–46.0)
HCT: 23.1 % — ABNORMAL LOW (ref 36.0–46.0)
Hemoglobin: 9.2 g/dL — ABNORMAL LOW (ref 12.0–15.0)
Hemoglobin: 8.6 g/dL — ABNORMAL LOW (ref 12.0–15.0)
Hemoglobin: 7.4 g/dL — ABNORMAL LOW (ref 12.0–15.0)
Hemoglobin: 7.2 g/dL — ABNORMAL LOW (ref 12.0–15.0)
MCH: 24.2 pg — ABNORMAL LOW (ref 26.0–34.0)
MCH: 24 pg — ABNORMAL LOW (ref 26.0–34.0)
MCH: 24.6 pg — ABNORMAL LOW (ref 26.0–34.0)
MCH: 24 pg — ABNORMAL LOW (ref 26.0–34.0)
MCHC: 31.1 g/dL (ref 30.0–36.0)
MCHC: 30.6 g/dL (ref 30.0–36.0)
MCHC: 31.6 g/dL (ref 30.0–36.0)
MCHC: 31.2 g/dL (ref 30.0–36.0)
MCV: 77.9 fL — ABNORMAL LOW (ref 80.0–100.0)
MCV: 78.3 fL — ABNORMAL LOW (ref 80.0–100.0)
MCV: 77.7 fL — ABNORMAL LOW (ref 80.0–100.0)
MCV: 77 fL — ABNORMAL LOW (ref 80.0–100.0)
Platelets: 415 10*3/uL — ABNORMAL HIGH (ref 150–400)
Platelets: 413 10*3/uL — ABNORMAL HIGH (ref 150–400)
Platelets: 319 10*3/uL (ref 150–400)
Platelets: 344 10*3/uL (ref 150–400)
RBC: 3.8 MIL/uL — ABNORMAL LOW (ref 3.87–5.11)
RBC: 3.59 MIL/uL — ABNORMAL LOW (ref 3.87–5.11)
RBC: 3.01 MIL/uL — ABNORMAL LOW (ref 3.87–5.11)
RBC: 3 MIL/uL — ABNORMAL LOW (ref 3.87–5.11)
RDW: 17 % — ABNORMAL HIGH (ref 11.5–15.5)
RDW: 16.9 % — ABNORMAL HIGH (ref 11.5–15.5)
RDW: 17 % — ABNORMAL HIGH (ref 11.5–15.5)
RDW: 17.1 % — ABNORMAL HIGH (ref 11.5–15.5)
WBC: 12.8 10*3/uL — ABNORMAL HIGH (ref 4.0–10.5)
WBC: 15.1 10*3/uL — ABNORMAL HIGH (ref 4.0–10.5)
WBC: 11.1 10*3/uL — ABNORMAL HIGH (ref 4.0–10.5)
WBC: 11.9 10*3/uL — ABNORMAL HIGH (ref 4.0–10.5)
nRBC: 0 % (ref 0.0–0.2)
nRBC: 0 % (ref 0.0–0.2)
nRBC: 0 % (ref 0.0–0.2)
nRBC: 0 % (ref 0.0–0.2)

## 2024-02-24 LAB — COMPREHENSIVE METABOLIC PANEL WITH GFR
ALT: 17 U/L (ref 0–44)
AST: 21 U/L (ref 15–41)
Albumin: 3.8 g/dL (ref 3.5–5.0)
Alkaline Phosphatase: 62 U/L (ref 38–126)
Anion gap: 9 (ref 5–15)
BUN: 6 mg/dL (ref 6–20)
CO2: 20 mmol/L — ABNORMAL LOW (ref 22–32)
Calcium: 8.8 mg/dL — ABNORMAL LOW (ref 8.9–10.3)
Chloride: 106 mmol/L (ref 98–111)
Creatinine, Ser: 0.53 mg/dL (ref 0.44–1.00)
GFR, Estimated: >60 mL/min (ref >60)
Glucose, Bld: 143 mg/dL — ABNORMAL HIGH (ref 70–99)
Potassium: 3.8 mmol/L (ref 3.5–5.1)
Sodium: 135 mmol/L (ref 135–145)
Total Bilirubin: 0.3 mg/dL (ref 0.0–1.2)
Total Protein: 7.3 g/dL (ref 6.5–8.1)

## 2024-02-24 LAB — LIPASE, BLOOD: Lipase: 30 U/L (ref 11–51)

## 2024-02-24 LAB — HCG, QUANTITATIVE, PREGNANCY: hCG, Beta Chain, Quant, S: 1927 m[IU]/mL — ABNORMAL HIGH (ref <5)

## 2024-02-24 SURGERY — LAPAROSCOPY, WITH ECTOPIC PREGNANCY SURGICAL TREATMENT
Anesthesia: General | Site: Abdomen | Laterality: Left

## 2024-02-24 MED ORDER — ALUM & MAG HYDROXIDE-SIMETH 200-200-20 MG/5ML PO SUSP
30.0000 mL | ORAL | Status: DC | PRN
  Administered 2024-02-24: 30 mL via ORAL
  Filled 2024-02-24: qty 30

## 2024-02-24 MED ORDER — MORPHINE SULFATE (PF) 2 MG/ML IV SOLN
1.0000 mg | INTRAVENOUS | Status: DC | PRN
  Administered 2024-02-24: 1 mg via INTRAVENOUS

## 2024-02-24 MED ORDER — ONDANSETRON HCL 4 MG/2ML IJ SOLN: 4.0000 mg | Freq: Four times a day (QID) | INTRAMUSCULAR | Status: DC | PRN

## 2024-02-24 MED ORDER — SODIUM CHLORIDE (PF) 0.9 % IJ SOLN
INTRAMUSCULAR | Status: AC
  Filled 2024-02-24: qty 10

## 2024-02-24 MED ORDER — PROPOFOL 10 MG/ML IV BOLUS
INTRAVENOUS | Status: DC | PRN
  Administered 2024-02-24: 30 mg via INTRAVENOUS
  Administered 2024-02-24: 20 mg via INTRAVENOUS
  Administered 2024-02-24: 30 mg via INTRAVENOUS
  Administered 2024-02-24: 200 mg via INTRAVENOUS

## 2024-02-24 MED ORDER — OXYCODONE HCL 5 MG PO TABS
ORAL_TABLET | ORAL | Status: AC
  Filled 2024-02-24: qty 1

## 2024-02-24 MED ORDER — LACTATED RINGERS IV SOLN: INTRAVENOUS | Status: DC

## 2024-02-24 MED ORDER — ROCURONIUM BROMIDE 10 MG/ML (PF) SYRINGE
PREFILLED_SYRINGE | INTRAVENOUS | Status: AC
  Filled 2024-02-24: qty 10

## 2024-02-24 MED ORDER — MIDAZOLAM HCL 2 MG/2ML IJ SOLN
INTRAMUSCULAR | Status: DC | PRN
  Administered 2024-02-24: 2 mg via INTRAVENOUS

## 2024-02-24 MED ORDER — OXYCODONE HCL 5 MG PO TABS
5.0000 mg | ORAL_TABLET | ORAL | Status: DC | PRN
  Administered 2024-02-24: 5 mg via ORAL

## 2024-02-24 MED ORDER — CHLORHEXIDINE GLUCONATE 0.12 % MT SOLN
OROMUCOSAL | Status: AC
  Administered 2024-02-24: 15 mL via OROMUCOSAL
  Filled 2024-02-24: qty 15

## 2024-02-24 MED ORDER — FENTANYL CITRATE (PF) 250 MCG/5ML IJ SOLN
INTRAMUSCULAR | Status: DC | PRN
Start: 2024-02-24 — End: 2024-02-24
  Administered 2024-02-24 (×5): 50 ug via INTRAVENOUS

## 2024-02-24 MED ORDER — IBUPROFEN 600 MG PO TABS
600.0000 mg | ORAL_TABLET | Freq: Four times a day (QID) | ORAL | Status: DC
  Administered 2024-02-24 – 2024-02-26 (×8): 600 mg via ORAL
  Filled 2024-02-24 (×8): qty 1

## 2024-02-24 MED ORDER — SUGAMMADEX SODIUM 200 MG/2ML IV SOLN
INTRAVENOUS | Status: DC | PRN
  Administered 2024-02-24: 200 mg via INTRAVENOUS

## 2024-02-24 MED ORDER — ACETAMINOPHEN 10 MG/ML IV SOLN
INTRAVENOUS | Status: AC
  Filled 2024-02-24: qty 100

## 2024-02-24 MED ORDER — LIDOCAINE 2% (20 MG/ML) 5 ML SYRINGE
INTRAMUSCULAR | Status: DC | PRN
  Administered 2024-02-24: 60 mg via INTRAVENOUS

## 2024-02-24 MED ORDER — ALBUMIN HUMAN 5 % IV SOLN: INTRAVENOUS | Status: DC | PRN

## 2024-02-24 MED ORDER — FENTANYL CITRATE (PF) 100 MCG/2ML IJ SOLN
INTRAMUSCULAR | Status: AC
  Filled 2024-02-24: qty 2

## 2024-02-24 MED ORDER — POVIDONE-IODINE 10 % EX SWAB: 2.0000 | Freq: Once | CUTANEOUS | Status: DC

## 2024-02-24 MED ORDER — MIDAZOLAM HCL 2 MG/2ML IJ SOLN
INTRAMUSCULAR | Status: AC
Start: 2024-02-24 — End: ?
  Filled 2024-02-24: qty 2

## 2024-02-24 MED ORDER — ORAL CARE MOUTH RINSE: 15.0000 mL | Freq: Once | OROMUCOSAL | Status: DC

## 2024-02-24 MED ORDER — ONDANSETRON HCL 4 MG/2ML IJ SOLN
INTRAMUSCULAR | Status: AC
  Filled 2024-02-24: qty 2

## 2024-02-24 MED ORDER — ACETAMINOPHEN 10 MG/ML IV SOLN
INTRAVENOUS | Status: DC | PRN
  Administered 2024-02-24: 1000 mg via INTRAVENOUS

## 2024-02-24 MED ORDER — SUCCINYLCHOLINE CHLORIDE 200 MG/10ML IV SOSY
PREFILLED_SYRINGE | INTRAVENOUS | Status: DC | PRN
  Administered 2024-02-24: 140 mg via INTRAVENOUS

## 2024-02-24 MED ORDER — ACETAMINOPHEN 325 MG PO TABS
650.0000 mg | ORAL_TABLET | Freq: Four times a day (QID) | ORAL | Status: DC
  Administered 2024-02-24 – 2024-02-26 (×8): 650 mg via ORAL
  Filled 2024-02-24 (×8): qty 2

## 2024-02-24 MED ORDER — KETOROLAC TROMETHAMINE 30 MG/ML IJ SOLN
INTRAMUSCULAR | Status: AC
  Filled 2024-02-24: qty 1

## 2024-02-24 MED ORDER — SENNOSIDES-DOCUSATE SODIUM 8.6-50 MG PO TABS: 1.0000 | ORAL_TABLET | Freq: Every evening | ORAL | Status: DC | PRN

## 2024-02-24 MED ORDER — DEXAMETHASONE SODIUM PHOSPHATE 10 MG/ML IJ SOLN
INTRAMUSCULAR | Status: AC
  Filled 2024-02-24: qty 1

## 2024-02-24 MED ORDER — LACTATED RINGERS IV BOLUS
1000.0000 mL | Freq: Once | INTRAVENOUS | Status: AC
  Administered 2024-02-24: 1000 mL via INTRAVENOUS

## 2024-02-24 MED ORDER — CEFAZOLIN SODIUM-DEXTROSE 2-3 GM-%(50ML) IV SOLR
INTRAVENOUS | Status: DC | PRN
  Administered 2024-02-24: 2 g via INTRAVENOUS

## 2024-02-24 MED ORDER — BUPIVACAINE HCL 0.25 % IJ SOLN
INTRAMUSCULAR | Status: DC | PRN
  Administered 2024-02-24: 18 mL

## 2024-02-24 MED ORDER — FENTANYL CITRATE (PF) 250 MCG/5ML IJ SOLN
INTRAMUSCULAR | Status: AC
  Filled 2024-02-24: qty 5

## 2024-02-24 MED ORDER — CEFAZOLIN SODIUM 1 G IJ SOLR
INTRAMUSCULAR | Status: AC
  Filled 2024-02-24: qty 20

## 2024-02-24 MED ORDER — PROPOFOL 10 MG/ML IV BOLUS
INTRAVENOUS | Status: AC
  Filled 2024-02-24: qty 20

## 2024-02-24 MED ORDER — KETOROLAC TROMETHAMINE 30 MG/ML IJ SOLN
INTRAMUSCULAR | Status: DC | PRN
  Administered 2024-02-24: 30 mg via INTRAVENOUS

## 2024-02-24 MED ORDER — DEXAMETHASONE SODIUM PHOSPHATE 10 MG/ML IJ SOLN
INTRAMUSCULAR | Status: DC | PRN
  Administered 2024-02-24: 10 mg via INTRAVENOUS

## 2024-02-24 MED ORDER — FENTANYL CITRATE (PF) 100 MCG/2ML IJ SOLN
25.0000 ug | INTRAMUSCULAR | Status: DC | PRN
  Administered 2024-02-24 (×2): 50 ug via INTRAVENOUS

## 2024-02-24 MED ORDER — CHLORHEXIDINE GLUCONATE 0.12 % MT SOLN: 15.0000 mL | Freq: Once | OROMUCOSAL | Status: DC

## 2024-02-24 MED ORDER — SIMETHICONE 80 MG PO CHEW: 80.0000 mg | CHEWABLE_TABLET | Freq: Four times a day (QID) | ORAL | Status: DC | PRN

## 2024-02-24 MED ORDER — KETAMINE HCL 50 MG/5ML IJ SOSY
PREFILLED_SYRINGE | INTRAMUSCULAR | Status: AC
  Filled 2024-02-24: qty 5

## 2024-02-24 MED ORDER — ROCURONIUM BROMIDE 10 MG/ML (PF) SYRINGE
PREFILLED_SYRINGE | INTRAVENOUS | Status: DC | PRN
  Administered 2024-02-24: 10 mg via INTRAVENOUS
  Administered 2024-02-24: 40 mg via INTRAVENOUS

## 2024-02-24 MED ORDER — ORAL CARE MOUTH RINSE: 15.0000 mL | Freq: Once | OROMUCOSAL | Status: AC

## 2024-02-24 MED ORDER — PROPOFOL 10 MG/ML IV BOLUS
INTRAVENOUS | Status: AC
Start: 2024-02-24 — End: ?
  Filled 2024-02-24: qty 20

## 2024-02-24 MED ORDER — ONDANSETRON HCL 4 MG PO TABS: 4.0000 mg | ORAL_TABLET | Freq: Four times a day (QID) | ORAL | Status: DC | PRN

## 2024-02-24 MED ORDER — PRENATAL MULTIVITAMIN CH
1.0000 | ORAL_TABLET | Freq: Every day | ORAL | Status: DC
  Administered 2024-02-24 – 2024-02-26 (×3): 1 via ORAL
  Filled 2024-02-24 (×3): qty 1

## 2024-02-24 MED ORDER — PHENYLEPHRINE 80 MCG/ML (10ML) SYRINGE FOR IV PUSH (FOR BLOOD PRESSURE SUPPORT)
PREFILLED_SYRINGE | INTRAVENOUS | Status: AC
  Filled 2024-02-24: qty 10

## 2024-02-24 MED ORDER — CHLORHEXIDINE GLUCONATE 0.12 % MT SOLN
15.0000 mL | Freq: Once | OROMUCOSAL | Status: AC
  Filled 2024-02-24: qty 15

## 2024-02-24 MED ORDER — DROPERIDOL 2.5 MG/ML IJ SOLN: 0.6250 mg | Freq: Once | INTRAMUSCULAR | Status: DC | PRN

## 2024-02-24 MED ORDER — KETAMINE HCL 10 MG/ML IJ SOLN
INTRAMUSCULAR | Status: DC | PRN
  Administered 2024-02-24: 20 mg via INTRAVENOUS

## 2024-02-24 MED ORDER — SODIUM CHLORIDE 0.9 % IR SOLN
Status: DC | PRN
  Administered 2024-02-24: 1000 mL

## 2024-02-24 MED ORDER — ONDANSETRON HCL 4 MG/2ML IJ SOLN
INTRAMUSCULAR | Status: DC | PRN
  Administered 2024-02-24: 4 mg via INTRAVENOUS

## 2024-02-24 MED ORDER — MORPHINE SULFATE (PF) 2 MG/ML IV SOLN
INTRAVENOUS | Status: AC
  Filled 2024-02-24: qty 1

## 2024-02-24 MED ORDER — BUPIVACAINE HCL (PF) 0.25 % IJ SOLN
INTRAMUSCULAR | Status: AC
  Filled 2024-02-24: qty 30

## 2024-02-24 SURGICAL SUPPLY — 36 items
APPLICATOR ARISTA FLEXITIP XL (MISCELLANEOUS) IMPLANT
BLADE SURG 11 STRL SS (BLADE) ×1 IMPLANT
COVER SURGICAL LIGHT HANDLE (MISCELLANEOUS) ×2 IMPLANT
DEFOGGER SCOPE WARMER CLEARIFY (MISCELLANEOUS) ×1 IMPLANT
DERMABOND ADVANCED .7 DNX12 (GAUZE/BANDAGES/DRESSINGS) ×2 IMPLANT
DRAPE SURG IRRIG POUCH 19X23 (DRAPES) ×2 IMPLANT
DRSG OPSITE POSTOP 3X4 (GAUZE/BANDAGES/DRESSINGS) ×2 IMPLANT
DURAPREP 26ML APPLICATOR (WOUND CARE) ×2 IMPLANT
GLOVE BIOGEL PI IND STRL 8 (GLOVE) ×2 IMPLANT
GLOVE NEODERM STER SZ 7 (GLOVE) ×1 IMPLANT
GLOVE SURG ORTHO 8.0 STRL STRW (GLOVE) ×2 IMPLANT
GOWN STRL REUS W/ TWL LRG LVL3 (GOWN DISPOSABLE) ×2 IMPLANT
GOWN STRL REUS W/ TWL XL LVL3 (GOWN DISPOSABLE) ×2 IMPLANT
HEMOSTAT ARISTA ABSORB 3G PWDR (HEMOSTASIS) IMPLANT
IRRIGATION SUCT STRKRFLW 2 WTP (MISCELLANEOUS) ×1 IMPLANT
KIT PINK PAD W/HEAD ARE REST (MISCELLANEOUS) ×2 IMPLANT
KIT PINK PAD W/HEAD ARM REST (MISCELLANEOUS) ×3 IMPLANT
KIT TURNOVER KIT B (KITS) ×2 IMPLANT
LIGASURE VESSEL 5MM BLUNT TIP (ELECTROSURGICAL) ×2 IMPLANT
NS IRRIG 1000ML POUR BTL (IV SOLUTION) ×2 IMPLANT
PACK LAPAROSCOPY BASIN (CUSTOM PROCEDURE TRAY) ×2 IMPLANT
POUCH LAPAROSCOPIC INSTRUMENT (MISCELLANEOUS) ×1 IMPLANT
SET TUBE SMOKE EVAC HIGH FLOW (TUBING) ×2 IMPLANT
SHEET LAVH (DRAPES) ×1 IMPLANT
SLEEVE Z-THREAD 5X100MM (TROCAR) ×1 IMPLANT
SUT MNCRL AB 4-0 PS2 18 (SUTURE) ×2 IMPLANT
SUT MON AB-0 CT1 36 (SUTURE) ×1 IMPLANT
SUT VICRYL 0 UR6 27IN ABS (SUTURE) ×2 IMPLANT
SYS ACCESS ABD 5X75MM KII FIOS (TROCAR) ×1 IMPLANT
SYSTEM BAG RETRIEVAL 10MM (BASKET) IMPLANT
TOWEL GREEN STERILE FF (TOWEL DISPOSABLE) ×4 IMPLANT
TRAY FOLEY W/BAG SLVR 14FR (SET/KITS/TRAYS/PACK) ×2 IMPLANT
TROCAR 11X100 Z THREAD (TROCAR) ×2 IMPLANT
TROCAR XCEL NON-BLD 5MMX100MML (ENDOMECHANICALS) ×2 IMPLANT
TROCAR Z-THREAD OPTICAL 5X100M (TROCAR) ×1 IMPLANT
WARMER LAPAROSCOPE (MISCELLANEOUS) ×2 IMPLANT

## 2024-02-24 NOTE — MAU Note (Addendum)
 Called to rm  by husband, "she feels like she is going to pass out."   Pt complaining of increased pain. Became pale and diaphoretic, gaze became fixed.  Pt leaned back onto bed.  Legs lifted onto.  Dr  was called to the rm.  Called for IV.  Sudden intense pain in lower mid abd, 10/10.

## 2024-02-24 NOTE — ED Triage Notes (Addendum)
 Pt arrived from home via POV c/o abd pain 10/10. Pt states that it feels like contractions. Pt thinks that she may be a few weeks pregnant. Pt states that she has been having all the symptoms n/v. Pt states that she is spotting at present. LMP 01/28/24

## 2024-02-24 NOTE — Transfer of Care (Signed)
 Immediate Anesthesia Transfer of Care Note  Patient: Joann Fleming  Procedure(s) Performed: LAPAROSCOPY, WITH ECTOPIC PREGNANCY SURGICAL TREATMENT (Left: Abdomen) SALPINGECTOMY, BILATERAL, LAPAROSCOPIC (Left: Abdomen)  Patient Location: PACU  Anesthesia Type:General  Level of Consciousness: drowsy, patient cooperative, and responds to stimulation  Airway & Oxygen Therapy: Patient Spontanous Breathing and Patient connected to nasal cannula oxygen  Post-op Assessment: Report given to RN and Post -op Vital signs reviewed and stable  Post vital signs: Reviewed and stable  Last Vitals:  Vitals Value Taken Time  BP 105/76 02/24/24 1028  Temp 97.7 02/24/24 1028  Pulse 106 02/24/24 1028  Resp 23 02/24/24 1028  SpO2 92 % 02/24/24 1028  Vitals shown include unfiled device data.  Pt denies pain  Patients Stated Pain Goal: 2 (02/24/24 1610)  Complications: No notable events documented.

## 2024-02-24 NOTE — MAU Note (Signed)
 Transport here. Pt has not jewely.  Husband has shoes and pj's.

## 2024-02-24 NOTE — H&P (Signed)
 OB/GYN Pre-Op History and Physical  Joann Fleming is a 28 y.o. G2P1001 presenting for acute abdominal pain and pink spotting.  She was seen in the main ED originally.   Pain had started at 9 pm and has gotten worse.  In MAU patient became diaphoretic and near syncopal.  Pain is intermittent, but intense.  Ultrasound findings are suspicious for ruptured ectopic.  28 y.o. G2P1001 with the above findings.   On exam, she had stable vital signs, but has been near syncopal. Patient was counseled regarding need for operative laparoscopy. Risks of surgery including bleeding which may require transfusion or reoperation, infection, injury to bowel or other surrounding organs, need for additional procedures including (laparoscopy or) laparotomy were explained to patient and written informed consent was obtained.  Patient has been NPO since admission and she will remain NPO for procedure. Anesthesia and OR aware.  Preoperative prophylactic antibiotics and SCDs ordered on call to the OR.  To OR when ready.        Past Medical History:  Diagnosis Date   Abnormal TSH 2018   Anemia    Blood transfusion without reported diagnosis 2019   due to anemia   Eczema    History of group B Streptococcus (GBS) infection    positive with pregnancy at 36 weeks   Hypothyroidism    Pregnancy induced hypertension    UTI (urinary tract infection)     Past Surgical History:  Procedure Laterality Date   NO PAST SURGERIES      OB History  Gravida Para Term Preterm AB Living  2 1 1   1   SAB IAB Ectopic Multiple Live Births     0 1    # Outcome Date GA Lbr Len/2nd Weight Sex Type Anes PTL Lv  2 Current           1 Term 03/22/23 [redacted]w[redacted]d 05:48 / 01:07 2440 g M Vag-Spont EPI  LIV    Obstetric Comments  2024  elevated BP at end preg, IOL    Social History   Socioeconomic History   Marital status: Single    Spouse name: Not on file   Number of children: Not on file   Years of education: Not on file   Highest  education level: Not on file  Occupational History   Not on file  Tobacco Use   Smoking status: Never   Smokeless tobacco: Never  Vaping Use   Vaping status: Never Used  Substance and Sexual Activity   Alcohol use: Not Currently   Drug use: Never   Sexual activity: Yes    Birth control/protection: None  Other Topics Concern   Not on file  Social History Narrative   Not on file   Social Drivers of Health   Financial Resource Strain: Low Risk  (12/10/2023)   Received from [facility]   Overall Financial Resource Strain (CARDIA)    Difficulty of Paying Living Expenses: Not hard at all  Food Insecurity: No Food Insecurity (12/10/2023)   Received from [facility]   Hunger Vital Sign    Worried About Running Out of Food in the Last Year: Never true    Ran Out of Food in the Last Year: Never true  Transportation Needs: No Transportation Needs (12/10/2023)   Received from [facility] - Transportation    Lack of Transportation (Medical): No    Lack of Transportation (Non-Medical): No  Physical Activity: Insufficiently Active (01/03/2023)   Exercise Vital Sign  Days of Exercise per Week: 2 days    Minutes of Exercise per Session: 30 min  Stress: No Stress Concern Present (01/03/2023)   [name] of Occupational Health - Occupational Stress Questionnaire    Feeling of Stress : Not at all  Social Connections: Moderately Integrated (01/03/2023)   Social Connection and Isolation Panel [NHANES]    Frequency of Communication with Friends and Family: More than three times a week    Frequency of Social Gatherings with Friends and Family: Once a week    Attends Religious Services: More than 4 times per year    Active Member of [employer] or Organizations: No    Attends Engineer, structural: Never    Marital Status: Living with partner    Family History  Problem Relation Age of Onset   Diabetes Mother    Diabetes type II Mother    Heart Problems Mother         unsure specifics   Healthy Father    Asthma Neg Hx    Cancer Neg Hx    Heart disease Neg Hx    Hypertension Neg Hx     Medications Prior to Admission  Medication Sig Dispense Refill Last Dose/Taking   acetaminophen  (TYLENOL ) 325 MG tablet Take 650 mg by mouth every 6 (six) hours as needed.   02/24/2024 at 12:05 AM   levothyroxine  (SYNTHROID ) 125 MCG tablet Take 1 tablet (125 mcg total) by mouth daily. 90 tablet 3 Past Month   norethindrone  (MICRONOR ) 0.35 MG tablet Take 1 tablet (0.35 mg total) by mouth daily. 84 tablet 3    nystatin  cream (MYCOSTATIN ) Apply 1 Application topically 2 (two) times daily. Apply to affected area BID for up to 7 days. 30 g 0 More than a month   Prenatal Vit-Fe Fumarate-FA (GOODSENSE PRENATAL VITAMINS) 28-0.8 MG TABS Take 1 tablet by mouth daily. Please fill with prenatal covered by insurance 30 tablet 11     No Known Allergies  Review of Systems: Negative except for what is mentioned in HPI.     Physical Exam: BP 117/66   Pulse 75   Temp 98 F (36.7 C) (Oral)   Resp 17   Ht 5\' 3"  (1.6 m)   Wt 90.3 kg   LMP 01/28/2024 (Approximate)   SpO2 100%   BMI 35.26 kg/m  CONSTITUTIONAL: Well-developed, well-nourished female in moderate acute distress.  HENT:  Normocephalic, atraumatic, External right and left ear normal. Oropharynx is clear and moist EYES: Conjunctivae and EOM are normal.  NECK: Normal range of motion, supple, no masses SKIN: Skin is warm and dry. No rash noted. mildly diaphoretic. No erythema. No pallor. NEUROLGIC: Alert and oriented to person, place, and time. Normal reflexes, muscle tone coordination. No cranial nerve deficit noted. PSYCHIATRIC: Normal mood and affect. Normal behavior. Normal judgment and thought content. CARDIOVASCULAR: Normal heart rate noted, regular rhythm RESPIRATORY: Effort and breath sounds normal, no problems with respiration noted ABDOMEN: Soft, exquisitely tender, nondistended,  PELVIC:  Deferred MUSCULOSKELETAL: Normal range of motion. No edema and no tenderness. 2+ distal pulses.   Pertinent Labs/Studies:   Results for orders placed or performed during the hospital encounter of 02/24/24 (from the past 72 hours)  Lipase, blood     Status: None   Collection Time: 02/24/24  3:50 AM  Result Value Ref Range   Lipase 30 11 - 51 U/L    Comment: Performed at [facility], [address] [place], [place] [address]  Comprehensive metabolic  panel     Status: Abnormal   Collection Time: 02/24/24  3:50 AM  Result Value Ref Range   Sodium 135 135 - 145 mmol/L   Potassium 3.8 3.5 - 5.1 mmol/L   Chloride 106 98 - 111 mmol/L   CO2 20 (L) 22 - 32 mmol/L   Glucose, Bld 143 (H) 70 - 99 mg/dL    Comment: Glucose reference range applies only to samples taken after fasting for at least 8 hours.   BUN 6 6 - 20 mg/dL   Creatinine, Ser 0.96 0.44 - 1.00 mg/dL   Calcium 8.8 (L) 8.9 - 10.3 mg/dL   Total Protein 7.3 6.5 - 8.1 g/dL   Albumin  3.8 3.5 - 5.0 g/dL   AST 21 15 - 41 U/L   ALT 17 0 - 44 U/L   Alkaline Phosphatase 62 38 - 126 U/L   Total Bilirubin 0.3 0.0 - 1.2 mg/dL   GFR, Estimated >04 >54 mL/min    Comment: (NOTE) Calculated using the CKD-EPI Creatinine Equation (2021)    Anion gap 9 5 - 15    Comment: Performed at [facility], [address] [place], [place] [address]  CBC     Status: Abnormal   Collection Time: 02/24/24  3:50 AM  Result Value Ref Range   WBC 12.8 (H) 4.0 - 10.5 K/uL   RBC 3.80 (L) 3.87 - 5.11 MIL/uL   Hemoglobin 9.2 (L) 12.0 - 15.0 g/dL   HCT 91.4 (L) 78.2 - 95.6 %   MCV 77.9 (L) 80.0 - 100.0 fL   MCH 24.2 (L) 26.0 - 34.0 pg   MCHC 31.1 30.0 - 36.0 g/dL   RDW 21.3 (H) 08.6 - 57.8 %   Platelets 415 (H) 150 - 400 K/uL   nRBC 0.0 0.0 - 0.2 %    Comment: Performed at [facility], [address], [place], [place] [address]  hCG, quantitative, pregnancy     Status: Abnormal   Collection Time: 02/24/24  3:50 AM  Result Value Ref  Range   hCG, Beta Chain, Quant, S 1,927 (H) <5 mIU/mL    Comment:          GEST. AGE      CONC.  (mIU/mL)   <=1 WEEK        5 - 50     2 WEEKS       50 - 500     3 WEEKS       100 - 10,000     4 WEEKS     1,000 - 30,000     5 WEEKS     3,500 - 115,000   6-8 WEEKS     12,000 - 270,000    12 WEEKS     15,000 - 220,000        FEMALE AND NON-PREGNANT FEMALE:     LESS THAN 5 mIU/mL Performed at [facility], [address] [place], [place] [address]        Assessment and Plan :Joann Fleming is a 28 y.o. G2P1001 here for probable ruptured ectopic.   Plan for operative laparoscopy, evacuation of hemoperitoneum, probable salpingectomy NPO Admission labs ordered Due to possible near syncope, will expedite surgery ASASP   Avie Boeck, M.D. Attending Obstetrician & Gynecologist, [facility] for [employer], [facility]

## 2024-02-24 NOTE — Brief Op Note (Signed)
 02/24/2024  10:13 AM  PATIENT:  Joann Fleming  28 y.o. female  PRE-OPERATIVE DIAGNOSIS:  Ectopic Pregnancy  POST-OPERATIVE DIAGNOSIS:  Ectopic Pregnancy  PROCEDURE:  Procedure(s): LAPAROSCOPY, WITH ECTOPIC PREGNANCY SURGICAL TREATMENT (Left) SALPINGECTOMY, BILATERAL, LAPAROSCOPIC (Left)  SURGEON:  Surgeons and Role:    * Abigail Abler, MD - Primary    * Aveon Colquhoun, Jodelle Mungo, MD - Assisting   ANESTHESIA:   local and general  EBL:  5 mL   BLOOD ADMINISTERED:none  DRAINS: none   LOCAL MEDICATIONS USED:  MARCAINE      SPECIMEN:  left fallopian tube, ruptured  DISPOSITION OF SPECIMEN:  PATHOLOGY  COUNTS:  YES  TOURNIQUET:  * No tourniquets in log *  DICTATION: .Note written in EPIC  PLAN OF CARE:  Admit to the floor for observation, potential discharge home  PATIENT DISPOSITION:  PACU - hemodynamically stable.   Delay start of Pharmacological VTE agent (>24hrs) due to surgical blood loss or risk of bleeding: not applicable

## 2024-02-24 NOTE — Anesthesia Preprocedure Evaluation (Signed)
 Anesthesia Evaluation  Patient identified by MRN, date of birth, ID band Patient awake    Reviewed: Allergy & Precautions, NPO status , Patient's Chart, lab work & pertinent test results  Airway Mallampati: II  TM Distance: >3 FB Neck ROM: Full    Dental  (+) Dental Advisory Given   Pulmonary neg pulmonary ROS   breath sounds clear to auscultation       Cardiovascular hypertension, Pt. on medications  Rhythm:Regular Rate:Normal     Neuro/Psych negative neurological ROS     GI/Hepatic negative GI ROS, Neg liver ROS,,,  Endo/Other  Hypothyroidism    Renal/GU negative Renal ROS     Musculoskeletal   Abdominal   Peds  Hematology  (+) Blood dyscrasia, anemia   Anesthesia Other Findings   Reproductive/Obstetrics                             Anesthesia Physical Anesthesia Plan  ASA: 2 and emergent  Anesthesia Plan: General   Post-op Pain Management: Ofirmev  IV (intra-op)*   Induction: Intravenous and Rapid sequence  PONV Risk Score and Plan: 3 and Dexamethasone , Ondansetron  and Treatment may vary due to age or medical condition  Airway Management Planned: Oral ETT  Additional Equipment:   Intra-op Plan:   Post-operative Plan: Extubation in OR  Informed Consent: I have reviewed the patients History and Physical, chart, labs and discussed the procedure including the risks, benefits and alternatives for the proposed anesthesia with the patient or authorized representative who has indicated his/her understanding and acceptance.     Dental advisory given  Plan Discussed with: CRNA  Anesthesia Plan Comments:        Anesthesia Quick Evaluation

## 2024-02-24 NOTE — MAU Provider Note (Signed)
 History     CSN: [id number]  Arrival date and time: 02/24/24 0334   None     Chief Complaint  Patient presents with   Abdominal Pain   Vaginal Bleeding   HPI Patient is a 28 year old presenting for pink spotting which progressed to bleeding as well as midline utero to this that radiates to her rectum.  Reports that the pain feels a little bit like contractions.  States that the pain started last night at 9 PM but has progressively gotten worse.  She also had nausea and vomiting while she was in the waiting room.  LMP was 01/28/2024.  Denies any other symptoms.  OB History     Gravida  2   Para  1   Term  1   Preterm      AB      Living  1      SAB      IAB      Ectopic      Multiple  0   Live Births  1           Past Medical History:  Diagnosis Date   Abnormal TSH 2018   Anemia    History of group B Streptococcus (GBS) infection    positive with pregnancy at 36 weeks   Hypothyroidism     Past Surgical History:  Procedure Laterality Date   NO PAST SURGERIES      Family History  Problem Relation Age of Onset   Diabetes Mother    Diabetes type II Mother    Asthma Neg Hx    Cancer Neg Hx    Heart disease Neg Hx    Hypertension Neg Hx     Social History   Tobacco Use   Smoking status: Never   Smokeless tobacco: Never  Vaping Use   Vaping status: Never Used  Substance Use Topics   Alcohol use: Not Currently   Drug use: Never    Allergies: No Known Allergies  Medications Prior to Admission  Medication Sig Dispense Refill Last Dose/Taking   levothyroxine  (SYNTHROID ) 125 MCG tablet Take 1 tablet (125 mcg total) by mouth daily. 90 tablet 3    norethindrone  (MICRONOR ) 0.35 MG tablet Take 1 tablet (0.35 mg total) by mouth daily. 84 tablet 3    nystatin  cream (MYCOSTATIN ) Apply 1 Application topically 2 (two) times daily. Apply to affected area BID for up to 7 days. 30 g 0    Prenatal Vit-Fe Fumarate-FA (GOODSENSE PRENATAL VITAMINS) 28-0.8  MG TABS Take 1 tablet by mouth daily. Please fill with prenatal covered by insurance 30 tablet 11     Review of Systems  Constitutional:  Negative for chills and fever.  Gastrointestinal:  Positive for abdominal pain, nausea and vomiting.  Genitourinary:  Positive for vaginal bleeding. Negative for vaginal discharge.   Physical Exam   Blood pressure 127/87, pulse 86, temperature 98 F (36.7 C), temperature source Oral, resp. rate 17, height 5\' 3"  (1.6 m), weight 90.3 kg, last menstrual period 01/28/2024, SpO2 100%, currently breastfeeding.  Physical Exam Vitals reviewed.  Constitutional:      General: She is in acute distress.     Appearance: She is well-developed.  HENT:     Head: Normocephalic.  Eyes:     Extraocular Movements: Extraocular movements intact.  Cardiovascular:     Rate and Rhythm: Normal rate and regular rhythm.  Pulmonary:     Effort: Pulmonary effort is normal.  Abdominal:  Tenderness: There is abdominal tenderness in the periumbilical area and suprapubic area. There is guarding and rebound.  Skin:    General: Skin is warm.     Capillary Refill: Capillary refill takes less than 2 seconds.  Neurological:     General: No focal deficit present.     Mental Status: She is alert.  Psychiatric:        Mood and Affect: Mood normal.     MAU Course  Procedures  MDM CBC Ultrasound Beta-hCG previously done   Assessment and Plan  Joann Fleming is a 28 yo G2P1 @ [redacted]w[redacted]d presenting for severe abdominal pain.  Ectopic pregnancy Abdominal pain Patient presenting for abdominal pain which started last night.  Having vaginal bleeding as well.  Pain is constantly at least a 5 but will increase to 10 with any movement or palpation of the abdomen.  She is exquisitely tender in her lower abdomen.  Initial CBC showing hemoglobin 9.2.  Beta-hCG 1900.  Ultrasound showing no IUP, yolk sac, fetal pole.  Poorly differentiated heterogeneous mass suspected within the left  adnexa concerning for ectopic pregnancy.  There was also small amount of complex fluid noted within the pelvis.  Patient's pain considerably worsened while evaluating her.  Concern for ruptured ectopic pregnancy.  Consulted on-call OB/GYN attending who came to evaluate.  Patient will be taken to the operating room for evaluation.  Joann Fleming 02/24/2024, 6:56 AM

## 2024-02-24 NOTE — Anesthesia Procedure Notes (Signed)
 Procedure Name: Intubation Date/Time: 02/24/2024 8:57 AM  Performed by: Alisia Apple, CRNAPre-anesthesia Checklist: Patient identified, Emergency Drugs available, Suction available and Patient being monitored Patient Re-evaluated:Patient Re-evaluated prior to induction Oxygen Delivery Method: Circle system utilized Preoxygenation: Pre-oxygenation with 100% oxygen Induction Type: IV induction and Rapid sequence Laryngoscope Size: Mac and 3 Grade View: Grade I Tube type: Oral Tube size: 7.0 mm Number of attempts: 1 Airway Equipment and Method: Stylet Placement Confirmation: ETT inserted through vocal cords under direct vision, positive ETCO2 and breath sounds checked- equal and bilateral Secured at: 21 cm Tube secured with: Tape Dental Injury: Teeth and Oropharynx as per pre-operative assessment  Comments: intubated by C. Jamil Armwood, CRNA; ebbs ;small cut to lower lip post intubation

## 2024-02-24 NOTE — Op Note (Signed)
 [facility] PROCEDURE DATE: 02/24/2024  PREOPERATIVE DIAGNOSIS: Ruptured ectopic pregnancy POSTOPERATIVE DIAGNOSIS: Ruptured left fallopian tube ectopic pregnancy PROCEDURE: Laparoscopic left salpingectomy and removal of ectopic pregnancy SURGEON:   Dr. Delford Felling, MD Dr. Raeanne Bull T. Alto Atta, MD ANESTHESIOLOGY TEAM: Anesthesiologist: Peggy Bowens, MD CRNA: Alisia Apple, CRNA  INDICATIONS: 28 y.o. G2P1001 at [redacted]w[redacted]d here with the preoperative diagnoses as listed above.  Please refer to preoperative notes for more details. Patient was counseled regarding need for laparoscopic salpingectomy. Risks of surgery including bleeding which may require transfusion or reoperation, infection, injury to bowel or other surrounding organs, need for additional procedures including laparotomy and other postoperative/anesthesia complications were explained to patient.  Written informed consent was obtained.  FINDINGS:  Significant amount of hemoperitoneum estimated to be about 400 ml of blood and clots.  Dilated and actively bleeding left fallopian tube containing ectopic gestation. Small normal appearing uterus, normal right fallopian tube, left ovary and right ovary with an approximately 5 cm cyst, no evidence of any bleeding from the right side.  ANESTHESIA: General, 1% bupivicaine local INTRAVENOUS FLUIDS: 1000 ml ESTIMATED BLOOD LOSS: 5 ml URINE OUTPUT: 200 ml SPECIMENS: Left fallopian tube containing ectopic gestation COMPLICATIONS: None immediate  PROCEDURE IN DETAIL:  The patient was taken to the operating room where general anesthesia was administered and was found to be adequate.  She was placed in the dorsal lithotomy position, and was prepped and draped in a sterile manner.  A Foley catheter was inserted into her bladder and attached to constant drainage and a uterine manipulator was then advanced into the uterus .    She was prepped and draped in the usual sterile fashion in the dorsal  lithotomy position. A skin incision was made with the 11 blade scalpel in the umbilicus. We entered using the Optiview port, intraperitoneal placement was confirmed. Pneumoperitoneum achieved to a pressure of 15. Hemoperitoneum noted in the pelvis. She was placed in steep Trendelenburg.  The scalpel was then used to make a 5 mm incision on the right, one 10 mm incision on the left  These ports were inserted under direct visualization. 0.25% marcaine  was injected at each port site at the start and finish of the case for a total of 18 ml of local.  The suction irrigator was then used to suction the hemoperitoneum and irrigate the pelvis. The pelvis was examined and the left fallopian tube was determined to be ruptured and bleeding. No bleeding noted from the cyst on the right. Attention was then turned to the left fallopian tube which was grasped and ligated from the underlying mesosalpinx and uterine attachment using the Ligasure instrument.  Good hemostasis was noted.  The specimen was removed through the left port. The pelvis was re-examined and noted to be hemostatic.  The abdomen was desufflated, and all instruments were removed from the abdomen and vagina. Foley catheter was removed.  The left port fascia was closed with 0-vicryl on a UR6 needle. The skin incisions were then closed in a subcuticular fashion with 4-0 vicryl, dermabond was applied. She was taken to recovery in stable condition.   Rh status: positive  She will be observed on the floor to determine if she is appropriate for discharge given her symptoms upon arrival as well as her significant hemoperitoneum.   Marci Setter, MD, FACOG Obstetrician & Gynecologist, [facility] for [facility], [facility]

## 2024-02-24 NOTE — MAU Note (Signed)
.  Joann Fleming is a 28 y.o. at Unknown here in MAU reporting: pink spotting that began last night at 2100-bleeding became bright red and light to moderate in amount. Reports uterine cramping is midline uterus radiating to rectal area. Pt to ultrasound  LMP: 01/28/2024 Onset of complaint: 2100 Pain score: 7 uterine cramping  Vitals:   02/24/24 0338  BP: (!) 140/93  Pulse: 94  Resp: 16  Temp: 98.7 F (37.1 C)  SpO2: 100%      Lab orders placed from triage: see orders

## 2024-02-25 ENCOUNTER — Encounter (HOSPITAL_COMMUNITY): Payer: Self-pay | Admitting: Obstetrics and Gynecology

## 2024-02-25 DIAGNOSIS — R21 Rash and other nonspecific skin eruption: Secondary | ICD-10-CM | POA: Insufficient documentation

## 2024-02-25 DIAGNOSIS — Z3A01 Less than 8 weeks gestation of pregnancy: Secondary | ICD-10-CM | POA: Diagnosis not present

## 2024-02-25 DIAGNOSIS — O00102 Left tubal pregnancy without intrauterine pregnancy: Secondary | ICD-10-CM | POA: Diagnosis not present

## 2024-02-25 LAB — CBC
HCT: 21.1 % — ABNORMAL LOW (ref 36.0–46.0)
HCT: 27.1 % — ABNORMAL LOW (ref 36.0–46.0)
Hemoglobin: 6.6 g/dL — CL (ref 12.0–15.0)
Hemoglobin: 8.6 g/dL — ABNORMAL LOW (ref 12.0–15.0)
MCH: 24.4 pg — ABNORMAL LOW (ref 26.0–34.0)
MCH: 25.2 pg — ABNORMAL LOW (ref 26.0–34.0)
MCHC: 31.3 g/dL (ref 30.0–36.0)
MCHC: 31.7 g/dL (ref 30.0–36.0)
MCV: 78.1 fL — ABNORMAL LOW (ref 80.0–100.0)
MCV: 79.5 fL — ABNORMAL LOW (ref 80.0–100.0)
Platelets: 313 10*3/uL (ref 150–400)
Platelets: 305 10*3/uL (ref 150–400)
RBC: 2.7 MIL/uL — ABNORMAL LOW (ref 3.87–5.11)
RBC: 3.41 MIL/uL — ABNORMAL LOW (ref 3.87–5.11)
RDW: 17.2 % — ABNORMAL HIGH (ref 11.5–15.5)
RDW: 18.1 % — ABNORMAL HIGH (ref 11.5–15.5)
WBC: 12.7 10*3/uL — ABNORMAL HIGH (ref 4.0–10.5)
WBC: 11.9 10*3/uL — ABNORMAL HIGH (ref 4.0–10.5)
nRBC: 0 % (ref 0.0–0.2)
nRBC: 0 % (ref 0.0–0.2)

## 2024-02-25 LAB — SURGICAL PATHOLOGY

## 2024-02-25 LAB — PREPARE RBC (CROSSMATCH)

## 2024-02-25 LAB — RPR: RPR Ser Ql: NONREACTIVE

## 2024-02-25 MED ORDER — SODIUM CHLORIDE 0.9% IV SOLUTION: Freq: Once | INTRAVENOUS | Status: AC

## 2024-02-25 NOTE — TOC Initial Note (Signed)
 Transition of Care (TOC) - Initial/Assessment Note   Spoke to patient at bedside. Patient from home with family.   No PCP listed . Patient reports PCP is Barbar Bonus at [facility]. NCM added PCP to face sheet   Transition of Care Department [facility]) will continue to monitor patient advancement through interdisciplinary progression rounds. If new patient transition needs arise, please place a TOC consult.   Patient Details  Name: Joann Fleming MRN: [id number] Date of Birth: [birth year]  Transition of Care [facility]) CM/SW Contact:    Charolet Cope, Annjanette Wertenberger  Erby Hatcher, RN Phone Number: 02/25/2024, 3:03 PM  Clinical Narrative:                   Expected Discharge Plan: Home/Self Care Barriers to Discharge: Continued Medical Work up   Patient Goals and CMS Choice Patient states their goals for this hospitalization and ongoing recovery are:: to return to home          Expected Discharge Plan and Services   Discharge Planning Services: CM Consult Post Acute Care Choice: NA Living arrangements for the past 2 months: Single Family Home                 DME Arranged: N/A DME Agency: NA       HH Arranged: NA HH Agency: NA        Prior Living Arrangements/Services Living arrangements for the past 2 months: Single Family Home Lives with:: Relatives Patient language and need for interpreter reviewed:: Yes Do you feel safe going back to the place where you live?: Yes      Need for Family Participation in Patient Care: Yes (Comment) Care giver support system in place?: Yes (comment)   Criminal Activity/Legal Involvement Pertinent to Current Situation/Hospitalization: No - Comment as needed  Activities of Daily Living   ADL Screening (condition at time of admission) Independently performs ADLs?: Yes (appropriate for developmental age) Is the patient deaf or have difficulty hearing?: No Does the patient have difficulty seeing, even when wearing glasses/contacts?: No Does the patient  have difficulty concentrating, remembering, or making decisions?: No  Permission Sought/Granted   Permission granted to share information with : No              Emotional Assessment Appearance:: Appears stated age Attitude/Demeanor/Rapport: Engaged Affect (typically observed): Appropriate Orientation: : Oriented to Self, Oriented to Place, Oriented to  Time, Oriented to Situation Alcohol / Substance Use: Not Applicable Psych Involvement: No (comment)  Admission diagnosis:  Ectopic pregnancy [O00.90] Patient Active Problem List   Diagnosis Date Noted   Rash 02/25/2024   Ectopic pregnancy 02/24/2024   Pure hypercholesterolemia 01/21/2024   History of pregnancy induced hypertension 01/21/2024   Family history of diabetes mellitus 01/21/2024   Weight gain 12/22/2023   Hashimoto's thyroiditis 12/22/2023   Class 2 obesity without serious comorbidity with body mass index (BMI) of 35.0 to 35.9 in adult 02/17/2023   Elevated prolactin level 12/08/2019   Hypothyroidism 12/10/2018   Hashimoto's disease 12/10/2018   PCP:  Orvis Blare, PA-C Pharmacy:   [facility] [phone] [facility] [place],  - [address] [place] [address] Phone: [phone] Fax: [phone]  [facility] [address] [place] [address] Phone: [phone] Fax: [phone]     Social Drivers of Health (SDOH) Social History: SDOH Screenings   Food Insecurity: No Food Insecurity (02/24/2024)  Housing: Low Risk  (02/24/2024)  Transportation Needs: No Transportation Needs (02/24/2024)  Utilities: Not At Risk (02/24/2024)  Alcohol Screen: Low Risk  (01/03/2023)  Depression (PHQ2-9): Low Risk  (03/18/2023)  Financial Resource Strain: Low Risk  (12/10/2023)   Received from [facility]  Physical Activity: Insufficiently Active (01/03/2023)  Social Connections: Moderately Integrated (01/03/2023)  Stress: No Stress  Concern Present (01/03/2023)  Tobacco Use: Low Risk  (02/24/2024)   SDOH Interventions:     Readmission Risk Interventions     No data to display

## 2024-02-25 NOTE — Progress Notes (Signed)
 Gynecology Progress Note  Admission Date: 02/24/2024 Current Date: 02/25/2024 9:23 AM  Joann Fleming is a 28 y.o. G2P1001 POD1 s/p laparoscopic left salpingectomy for ruptured ectopic pregnancy   Doing well this morning. A bit weak and dizzy with ambulation. Tolerating diet. Pain controlled with medication. Voiding without issue.   History complicated by: Patient Active Problem List   Diagnosis Date Noted   Ectopic pregnancy 02/24/2024   Pure hypercholesterolemia 01/21/2024   History of pregnancy induced hypertension 01/21/2024   Family history of diabetes mellitus 01/21/2024   Weight gain 12/22/2023   Hashimoto's thyroiditis 12/22/2023   Class 2 obesity without serious comorbidity with body mass index (BMI) of 35.0 to 35.9 in adult 02/17/2023   Elevated prolactin level 12/08/2019   Hypothyroidism 12/10/2018   Hashimoto's disease 12/10/2018    ROS and patient/family/surgical history, located on admission H&P note dated 02/24/2024, have been reviewed, and there are no changes except as noted below    Objective:   Vitals:   02/24/24 2040 02/24/24 2317 02/25/24 0351 02/25/24 0742  BP: 113/68 126/74 108/65 116/78  Pulse: 94 94 92 81  Resp: 17 17 19 16   Temp: 98.2 F (36.8 C) 98.3 F (36.8 C) 98.6 F (37 C) 98.1 F (36.7 C)  TempSrc: Oral Oral Oral Oral  SpO2: 100% 100% 100% 100%  Weight:      Height:        Temp:  [97.7 F (36.5 C)-98.6 F (37 C)] 98.1 F (36.7 C) (04/23 0742) Pulse Rate:  [81-114] 81 (04/23 0742) Resp:  [16-29] 16 (04/23 0742) BP: (105-126)/(62-96) 116/78 (04/23 0742) SpO2:  [92 %-100 %] 100 % (04/23 0742) I/O last 3 completed shifts: In: 2050 [P.O.:240; I.V.:1200; IV Piggyback:610] Out: 355 [Urine:350; Blood:5] No intake/output data recorded.  Intake/Output Summary (Last 24 hours) at 02/25/2024 0923 Last data filed at 02/24/2024 2300 Gross per 24 hour  Intake 2040 ml  Output 355 ml  Net 1685 ml     Current Vital Signs 24h Vital Sign Ranges   T 98.1 F (36.7 C) Temp  Avg: 98.2 F (36.8 C)  Min: 97.7 F (36.5 C)  Max: 98.6 F (37 C)  BP 116/78 BP  Min: 105/76  Max: 126/74  HR 81 Pulse  Avg: 94.8  Min: 81  Max: 114  RR 16 Resp  Avg: 19.4  Min: 16  Max: 29  SaO2 100 % Room Air SpO2  Avg: 96 %  Min: 92 %  Max: 100 %       24 Hour I/O Current Shift I/O  Time Ins Outs 04/22 0701 - 04/23 0700 In: 2050 [P.O.:240; I.V.:1200] Out: 355 [Urine:350] No intake/output data recorded.   Patient Vitals for the past 12 hrs:  BP Temp Temp src Pulse Resp SpO2  02/25/24 0742 116/78 98.1 F (36.7 C) Oral 81 16 100 %  02/25/24 0351 108/65 98.6 F (37 C) Oral 92 19 100 %  02/24/24 2317 126/74 98.3 F (36.8 C) Oral 94 17 100 %     Patient Vitals for the past 24 hrs:  BP Temp Temp src Pulse Resp SpO2  02/25/24 0742 116/78 98.1 F (36.7 C) Oral 81 16 100 %  02/25/24 0351 108/65 98.6 F (37 C) Oral 92 19 100 %  02/24/24 2317 126/74 98.3 F (36.8 C) Oral 94 17 100 %  02/24/24 2040 113/68 98.2 F (36.8 C) Oral 94 17 100 %  02/24/24 1354 (!) 121/96 98.5 F (36.9 C) Oral 98 17 99 %  02/24/24 1145 106/72 98 F (36.7 C) -- 98 19 93 %  02/24/24 1130 111/71 -- -- 88 18 92 %  02/24/24 1115 113/72 -- -- 86 19 95 %  02/24/24 1100 114/64 -- -- 90 18 92 %  02/24/24 1045 114/68 -- -- 97 (!) 21 94 %  02/24/24 1030 114/62 -- -- (!) 106 (!) 23 92 %  02/24/24 1027 105/76 97.7 F (36.5 C) -- (!) 114 (!) 29 95 %    Physical exam: General appearance: alert, cooperative, and appears stated age Abdomen: soft, non-tender; bowel sounds normal; no masses,  no organomegaly GU: No gross VB Lungs: clear to auscultation bilaterally Heart: regular rate and rhythm Extremities: no lower extremity edema Skin: diffuse rash over trunk and arms Psych: appropriate Neurologic: Grossly normal  Medications Current Facility-Administered Medications  Medication Dose Route Frequency Provider Last Rate Last Admin   0.9 %  sodium chloride  infusion (Manually  program via Guardrails IV Fluids)   Intravenous Once Marci Setter, MD       acetaminophen  (TYLENOL ) tablet 650 mg  650 mg Oral Q6H Marci Setter, MD   650 mg at 02/25/24 0535   alum & mag hydroxide-simeth (MAALOX/MYLANTA) 200-200-20 MG/5ML suspension 30 mL  30 mL Oral Q4H PRN Marci Setter, MD   30 mL at 02/24/24 2320   ibuprofen  (ADVIL ) tablet 600 mg  600 mg Oral Q6H Marci Setter, MD   600 mg at 02/25/24 0533   morphine  (PF) 2 MG/ML injection 1-2 mg  1-2 mg Intravenous Q3H PRN Marci Setter, MD   1 mg at 02/24/24 1234   ondansetron  (ZOFRAN ) tablet 4 mg  4 mg Oral Q6H PRN Marci Setter, MD       Or   ondansetron  (ZOFRAN ) injection 4 mg  4 mg Intravenous Q6H PRN Marci Setter, MD       oxyCODONE  (Oxy IR/ROXICODONE ) immediate release tablet 5 mg  5 mg Oral Q4H PRN Marci Setter, MD   5 mg at 02/24/24 1235   prenatal multivitamin tablet 1 tablet  1 tablet Oral Q1200 Marci Setter, MD   1 tablet at 02/24/24 1614   senna-docusate (Senokot-S) tablet 1 tablet  1 tablet Oral QHS PRN Marci Setter, MD       simethicone  (MYLICON) chewable tablet 80 mg  80 mg Oral QID PRN Marci Setter, MD          Labs  Recent Labs  Lab 02/24/24 1035 02/24/24 1541 02/25/24 0639  WBC 11.1* 11.9* 12.7*  HGB 7.4* 7.2* 6.6*  HCT 23.4* 23.1* 21.1*  PLT 319 344 313    Recent Labs  Lab 02/24/24 0350  NA 135  K 3.8  CL 106  CO2 20*  BUN 6  CREATININE 0.53  CALCIUM 8.8*  PROT 7.3  BILITOT 0.3  ALKPHOS 62  ALT 17  AST 21  GLUCOSE 143*       Assessment & Plan:  Postop: doing well, meeting milestones Anemia: hgb 6.6 this am, patient is symptomatic. s/p hemoperiteoneum from ruptured ectopic. Discussed risks/benefits of blood transfusion including improved recovery, perfusion of organs, risks including transfusion reaction, infection including HIV or hepatitis, though rare. Patient consents to transfusion  -- transfuse 2 units pRBCs 3. Rash: per patient has been present  for weeks to months, has upcoming appt with dermatology, will add on RPR 4. Dispo: potential discharge later today    Total time taking care of the patient was 54  minutes, with greater than 50% of the time spent in face to face interaction with the patient.  Marci Setter, MD, FACOG Obstetrician & Gynecologist, [facility] for [facility], [facility]

## 2024-02-25 NOTE — Plan of Care (Signed)

## 2024-02-25 NOTE — Discharge Summary (Signed)
 Physician Discharge Summary  Patient ID: Joann Fleming MRN: [id number] DOB/AGE: 03/30/1996 28 y.o.  Admit date: 02/24/2024 Discharge date: 02/26/2024  Admission Diagnoses: Ruptured ectopic pregnancy  Discharge Diagnoses:  Principal Problem:   Ectopic pregnancy Active Problems:   Rash   Discharged Condition: good  Hospital Course: Joann Fleming is a 28 y.o. G2P1001 presenting for acute abdominal pain and pink spotting.  She was seen in the main ED originally.   Pain had started at 9 pm and has gotten worse.  In MAU patient became diaphoretic and near syncopal.  Pain is intermittent, but intense.  Ultrasound findings are suspicious for ruptured ectopic.   28 y.o. G2P1001 with the above findings.   On exam, she had stable vital signs, but has been near syncopal. Patient was counseled regarding need for operative laparoscopy. Risks of surgery including bleeding which may require transfusion or reoperation, infection, injury to bowel or other surrounding organs, need for additional procedures including (laparoscopy or) laparotomy were explained to patient and written informed consent was obtained.  Patient has been NPO since admission and she will remain NPO for procedure. Anesthesia and OR aware.  Preoperative prophylactic antibiotics and SCDs ordered on call to the OR.  To OR when ready.  She underwent laparoscopic left salpingectomy and evacuation of approximately 400 ml of hemoperitoneum. Her hemoglobin had a nadir at 6.6. She was transfused 2 units of pRBCs. Her posttransfusion CBC 06/12/26.4. She was discharge home on 02/26/24  Consults: None  Significant Diagnostic Studies: radiology: Ultrasound: suggested left ectopic pregnancy and hemoperitoneum  Treatments: surgery: operative laparoscopy with left salpingectomy  Discharge Exam: Blood pressure (!) 123/92, pulse 66, temperature 98.4 F (36.9 C), temperature source Oral, resp. rate 17, height (P) 5\' 3"  (1.6 m), weight (P) 90.3 kg,  last menstrual period 01/28/2024, SpO2 100%, currently breastfeeding. General appearance: alert, cooperative, and no distress Head: Normocephalic, without obvious abnormality, atraumatic Resp: clear to auscultation bilaterally Cardio: regular rate and rhythm Extremities: extremities normal, atraumatic, no cyanosis or edema and Homans sign is negative, no sign of DVT Incision/Wound:  Disposition: Discharge disposition: 01-Home or Self Care       Discharge Instructions     Call MD for:  difficulty breathing, headache or visual disturbances   Complete by: As directed    Call MD for:  persistant dizziness or light-headedness   Complete by: As directed    Call MD for:  persistant nausea and vomiting   Complete by: As directed    Call MD for:  redness, tenderness, or signs of infection (pain, swelling, redness, odor or green/yellow discharge around incision site)   Complete by: As directed    Call MD for:  severe uncontrolled pain   Complete by: As directed    Call MD for:  temperature >100.4   Complete by: As directed    Diet general   Complete by: As directed    Driving Restrictions   Complete by: As directed    No driving or heavy lifting for 3-5 days or if still taking narcotic medication   Increase activity slowly   Complete by: As directed    No dressing needed   Complete by: As directed    Sexual Activity Restrictions   Complete by: As directed    Pelvic rest 2-3 weeks      Allergies as of 02/26/2024   No Known Allergies      Medication List     TAKE these medications    acetaminophen  325 MG  tablet Commonly known as: TYLENOL  Take 650 mg by mouth every 6 (six) hours as needed.   calcium carbonate 500 MG chewable tablet Commonly known as: TUMS - dosed in mg elemental calcium Chew 1 tablet by mouth as needed for indigestion or heartburn.   ferrous sulfate  325 (65 FE) MG tablet Take 1 tablet (325 mg total) by mouth every other day.   fexofenadine 180 MG  tablet Commonly known as: ALLEGRA Take 180 mg by mouth as needed for allergies or rhinitis.   ibuprofen  600 MG tablet Commonly known as: ADVIL  Take 1 tablet (600 mg total) by mouth every 6 (six) hours.   levothyroxine  125 MCG tablet Commonly known as: SYNTHROID  Take 1 tablet (125 mcg total) by mouth daily.   oxyCODONE  5 MG immediate release tablet Commonly known as: Oxy IR/ROXICODONE  Take 1 tablet (5 mg total) by mouth every 4 (four) hours as needed for up to 5 days for severe pain (pain score 7-10).               Discharge Care Instructions  (From admission, onward)           Start     Ordered   02/26/24 0000  No dressing needed        02/26/24 1407            Follow-up Information     [facility]. Schedule an appointment as soon as possible for a visit in 3 week(s).   Specialty: Obstetrics and Gynecology Why: post op, any MD Contact information: [address] [place]  [id number] [phone]               Avie Boeck, MD Faculty attending, Center for [facility]

## 2024-02-25 NOTE — Progress Notes (Signed)
 Per lab, critical hemoglobin at 6.6 at 0736.  Notified Dunn, MD at [phone].

## 2024-02-25 NOTE — Anesthesia Postprocedure Evaluation (Signed)
 Anesthesia Post Note  Patient: Joann Fleming  Procedure(s) Performed: LAPAROSCOPY, WITH ECTOPIC PREGNANCY SURGICAL TREATMENT (Left: Abdomen) SALPINGECTOMY, UNILATERAL, LAPAROSCOPIC (Left: Abdomen)     Patient location during evaluation: PACU Anesthesia Type: General Level of consciousness: awake and alert Pain management: pain level controlled Vital Signs Assessment: post-procedure vital signs reviewed and stable Respiratory status: spontaneous breathing, nonlabored ventilation, respiratory function stable and patient connected to nasal cannula oxygen Cardiovascular status: blood pressure returned to baseline and stable Postop Assessment: no apparent nausea or vomiting Anesthetic complications: no   There were no known notable events for this encounter.  Last Vitals:  Vitals:   02/25/24 1030 02/25/24 1045  BP: 117/61 106/63  Pulse: 94 88  Resp: 17 18  Temp: 36.9 C 36.6 C  SpO2: 99% 97%    Last Pain:  Vitals:   02/25/24 1317  TempSrc:   PainSc: 2                  Melvenia Stabs

## 2024-02-26 ENCOUNTER — Other Ambulatory Visit (HOSPITAL_COMMUNITY): Payer: Self-pay

## 2024-02-26 DIAGNOSIS — O00102 Left tubal pregnancy without intrauterine pregnancy: Secondary | ICD-10-CM | POA: Diagnosis not present

## 2024-02-26 DIAGNOSIS — Z3A01 Less than 8 weeks gestation of pregnancy: Secondary | ICD-10-CM

## 2024-02-26 LAB — TYPE AND SCREEN
ABO/RH(D): O POS
Antibody Screen: NEGATIVE
Unit division: 0
Unit division: 0

## 2024-02-26 LAB — BPAM RBC
Blood Product Expiration Date: 202505152359
Blood Product Expiration Date: 202505182359
ISSUE DATE / TIME: 202504231019
ISSUE DATE / TIME: 202504231450
Unit Type and Rh: 5100
Unit Type and Rh: 5100

## 2024-02-26 LAB — CBC
HCT: 27.4 % — ABNORMAL LOW (ref 36.0–46.0)
Hemoglobin: 8.9 g/dL — ABNORMAL LOW (ref 12.0–15.0)
MCH: 25.6 pg — ABNORMAL LOW (ref 26.0–34.0)
MCHC: 32.5 g/dL (ref 30.0–36.0)
MCV: 79 fL — ABNORMAL LOW (ref 80.0–100.0)
Platelets: 322 10*3/uL (ref 150–400)
RBC: 3.47 MIL/uL — ABNORMAL LOW (ref 3.87–5.11)
RDW: 18.2 % — ABNORMAL HIGH (ref 11.5–15.5)
WBC: 11.9 10*3/uL — ABNORMAL HIGH (ref 4.0–10.5)
nRBC: 0.3 % — ABNORMAL HIGH (ref 0.0–0.2)

## 2024-02-26 MED ORDER — FERROUS SULFATE 325 (65 FE) MG PO TABS
325.0000 mg | ORAL_TABLET | ORAL | 3 refills | Status: AC
  Filled 2024-02-26: qty 30, 60d supply, fill #0

## 2024-02-26 MED ORDER — IBUPROFEN 600 MG PO TABS
600.0000 mg | ORAL_TABLET | Freq: Four times a day (QID) | ORAL | 0 refills | Status: DC
Start: 2024-02-26 — End: 2024-04-07
  Filled 2024-02-26: qty 30, 8d supply, fill #0

## 2024-02-26 MED ORDER — OXYCODONE HCL 5 MG PO TABS
5.0000 mg | ORAL_TABLET | ORAL | 0 refills | Status: AC | PRN
  Filled 2024-02-26: qty 24, 4d supply, fill #0

## 2024-02-26 NOTE — Plan of Care (Signed)

## 2024-02-26 NOTE — Progress Notes (Signed)
 DISCHARGE NOTE HOME Joann Fleming to be discharged Home per MD order. Discussed prescriptions and follow up appointments with the patient. Prescriptions given to patient; medication list explained in detail. Patient verbalized understanding.  Skin clean, dry and intact without evidence of skin break down, no evidence of skin tears noted. IV catheter discontinued intact. Site without signs and symptoms of complications. Dressing and pressure applied. Pt denies pain at the site currently. No complaints noted.  See LDA for incisions present at discharge Abdominal  An After Visit Summary (AVS) was printed and given to the patient. Patient escorted via wheelchair, and discharged home via private auto.  [name], RN

## 2024-03-03 ENCOUNTER — Ambulatory Visit (HOSPITAL_BASED_OUTPATIENT_CLINIC_OR_DEPARTMENT_OTHER): Payer: Self-pay | Admitting: Obstetrics & Gynecology

## 2024-03-03 ENCOUNTER — Encounter (HOSPITAL_BASED_OUTPATIENT_CLINIC_OR_DEPARTMENT_OTHER): Payer: Self-pay | Admitting: Obstetrics & Gynecology

## 2024-03-03 VITALS — BP 112/69 | HR 69 | Ht 63.0 in | Wt 207.4 lb

## 2024-03-03 DIAGNOSIS — O00102 Left tubal pregnancy without intrauterine pregnancy: Secondary | ICD-10-CM

## 2024-03-03 DIAGNOSIS — N939 Abnormal uterine and vaginal bleeding, unspecified: Secondary | ICD-10-CM

## 2024-03-03 DIAGNOSIS — D500 Iron deficiency anemia secondary to blood loss (chronic): Secondary | ICD-10-CM | POA: Diagnosis not present

## 2024-03-03 NOTE — Progress Notes (Signed)
 GYNECOLOGY  VISIT  CC:   bleeding after ectopic surgery  HPI: 28 y.o. G2P1001 Single Other or two or more races female here for heavy bleeding that started over the weekend.  She underwent laparoscopy for ruptured ectopic pregnancy on 4/22.  Left salpingectomy was performed.  Pathology reviewed.  Implantation site was noted with POC present but no villi was present.  Was anemia with hb of 6.6 noted at time of surgery.  She did receive 2 units PRBC.  Was discharged day after surgery and after post transfusion hb was 8.6.  Was surprised when she started bleeding this past weekend.  Was rather heavy with passing clots on Monday and Tuesday.  She reached out with concerns about bleeding.  Today, it is improved and she is not passing clots.  Voiding normally.  No fevers.  Normal GI function.  Reports she is not having any SOB or palpitations.     Past Medical History:  Diagnosis Date   Abnormal TSH 2018   Anemia    Blood transfusion without reported diagnosis 2019   due to anemia   Eczema    History of group B Streptococcus (GBS) infection    positive with pregnancy at 36 weeks   Hypothyroidism    Pregnancy induced hypertension    UTI (urinary tract infection)     MEDS:   Current Outpatient Medications on File Prior to Visit  Medication Sig Dispense Refill   acetaminophen  (TYLENOL ) 325 MG tablet Take 650 mg by mouth every 6 (six) hours as needed.     ferrous sulfate  325 (65 FE) MG tablet Take 1 tablet (325 mg total) by mouth every other day. 30 tablet 3   ibuprofen  (ADVIL ) 600 MG tablet Take 1 tablet (600 mg total) by mouth every 6 (six) hours. 30 tablet 0   calcium carbonate (TUMS - DOSED IN MG ELEMENTAL CALCIUM) 500 MG chewable tablet Chew 1 tablet by mouth as needed for indigestion or heartburn. (Patient not taking: Reported on 03/03/2024)     fexofenadine (ALLEGRA) 180 MG tablet Take 180 mg by mouth as needed for allergies or rhinitis. (Patient not taking: Reported on 03/03/2024)      levothyroxine  (SYNTHROID ) 125 MCG tablet Take 1 tablet (125 mcg total) by mouth daily. (Patient not taking: Reported on 02/24/2024) 90 tablet 3   No current facility-administered medications on file prior to visit.    ALLERGIES: Patient has no known allergies.  SH:  single, non smoker  Review of Systems  Constitutional: Negative.   Genitourinary:        Heavier vaginal bleeding    PHYSICAL EXAMINATION:    BP 112/69 (BP Location: Left Arm, Patient Position: Sitting, Cuff Size: Normal)   Pulse 69   Ht 5\' 3"  (1.6 m)   Wt 207 lb 6.4 oz (94.1 kg)   LMP 01/28/2024 (Approximate)   Breastfeeding No   BMI 36.74 kg/m     General appearance: alert, cooperative and appears stated age CV:  Regular rate and rhythm Lungs:  clear to auscultation, no wheezes, rales or rhonchi, symmetric air entry Abdomen: soft, non-tender; bowel sounds normal; no masses,  no organomegaly Inc:  C/D/I Lymph:  no inguinal LAD noted  Pelvic: External genitalia:  no lesions              Urethra:  normal appearing urethra with no masses, tenderness or lesions              Bartholins and Skenes: normal  Vagina: normal mucosa without prolapse or lesions and dark blood in vault, no clots, bleeding is present but not brist              Cervix: no lesions              Bimanual Exam:  Uterus:  normal size, contour, position, consistency, mobility, non-tender              Adnexa: no mass, fullness, tenderness  Chaperone, Myrtie Atkinson, CMA, was present for exam.  Assessment/Plan: 1. Left tubal pregnancy without intrauterine pregnancy (Primary) - Rh positive blood type - will check HCG level today and follow to negative  2. Anemia, blood loss - will recheck hb today.    3. Abnormal vaginal bleeding - Discussed reasons for bleeding which include drop in HCG after treatment of ectopic. - due to anemia and having blood transfusion last week, feel it is important to really control her bleeding.   Treatment with progesterone discussed.  As bleeding is much better, she would like to monitor and not take any new medications. Advised pt to please call if menstrual bleeding does not continue to just slowly taper off

## 2024-03-04 LAB — BETA HCG QUANT (REF LAB): hCG Quant: 16 m[IU]/mL

## 2024-03-05 NOTE — Addendum Note (Signed)
 Addended by: Lillian Rein on: 03/05/2024 09:31 PM   Modules accepted: Level of Service

## 2024-03-08 ENCOUNTER — Ambulatory Visit (INDEPENDENT_AMBULATORY_CARE_PROVIDER_SITE_OTHER): Admitting: Internal Medicine

## 2024-03-18 ENCOUNTER — Other Ambulatory Visit (HOSPITAL_BASED_OUTPATIENT_CLINIC_OR_DEPARTMENT_OTHER): Payer: Self-pay

## 2024-03-19 ENCOUNTER — Other Ambulatory Visit (HOSPITAL_BASED_OUTPATIENT_CLINIC_OR_DEPARTMENT_OTHER)

## 2024-03-19 ENCOUNTER — Other Ambulatory Visit (HOSPITAL_COMMUNITY): Payer: Self-pay | Admitting: Physician Assistant

## 2024-03-19 DIAGNOSIS — O00102 Left tubal pregnancy without intrauterine pregnancy: Secondary | ICD-10-CM

## 2024-03-20 LAB — BETA HCG QUANT (REF LAB): hCG Quant: <1 m[IU]/mL

## 2024-03-24 ENCOUNTER — Ambulatory Visit (HOSPITAL_BASED_OUTPATIENT_CLINIC_OR_DEPARTMENT_OTHER): Payer: Self-pay | Admitting: Obstetrics & Gynecology

## 2024-04-06 ENCOUNTER — Ambulatory Visit: Admitting: Obstetrics and Gynecology

## 2024-04-07 ENCOUNTER — Encounter (INDEPENDENT_AMBULATORY_CARE_PROVIDER_SITE_OTHER): Payer: Self-pay | Admitting: Internal Medicine

## 2024-04-07 ENCOUNTER — Ambulatory Visit (INDEPENDENT_AMBULATORY_CARE_PROVIDER_SITE_OTHER): Admitting: Internal Medicine

## 2024-04-07 VITALS — BP 126/84 | HR 66 | Temp 97.9°F | Ht 63.0 in | Wt 204.0 lb

## 2024-04-07 DIAGNOSIS — D509 Iron deficiency anemia, unspecified: Secondary | ICD-10-CM | POA: Insufficient documentation

## 2024-04-07 DIAGNOSIS — R5383 Other fatigue: Secondary | ICD-10-CM

## 2024-04-07 DIAGNOSIS — D508 Other iron deficiency anemias: Secondary | ICD-10-CM

## 2024-04-07 DIAGNOSIS — Z6836 Body mass index (BMI) 36.0-36.9, adult: Secondary | ICD-10-CM | POA: Diagnosis not present

## 2024-04-07 DIAGNOSIS — Z1331 Encounter for screening for depression: Secondary | ICD-10-CM | POA: Diagnosis not present

## 2024-04-07 DIAGNOSIS — E669 Obesity, unspecified: Secondary | ICD-10-CM

## 2024-04-07 DIAGNOSIS — E063 Autoimmune thyroiditis: Secondary | ICD-10-CM

## 2024-04-07 DIAGNOSIS — Z6835 Body mass index (BMI) 35.0-35.9, adult: Secondary | ICD-10-CM

## 2024-04-07 DIAGNOSIS — R0602 Shortness of breath: Secondary | ICD-10-CM

## 2024-04-07 DIAGNOSIS — R7989 Other specified abnormal findings of blood chemistry: Secondary | ICD-10-CM

## 2024-04-07 NOTE — Assessment & Plan Note (Signed)
 She was diagnosed with Hashimoto's in 2019 she is now on levothyroxine  she is followed by endocrinology.  She reports a gradual weight gain after her diagnosis of hypothyroidism.  Most recent TSH was 9.69 her levothyroxine  is currently being adjusted as she has a follow-up this month.

## 2024-04-07 NOTE — Assessment & Plan Note (Addendum)
 Managed by primary care team. On Iron  supplementation.  Reports having bleeding complications with recent surgery.  Recommend repeating CBC and iron  studies after completing iron  supplementation.  Patient advised to take a multivitamin

## 2024-04-07 NOTE — Assessment & Plan Note (Signed)
 Improved per patient, followed by Endo.

## 2024-04-07 NOTE — Progress Notes (Signed)
 [address] [place],  [place], [place] [address]  Office: [phone]  /  Fax: [phone]   Subjective   Initial Visit  Joann Fleming (MR# [id number]) is a 28 y.o. female who presents for evaluation and treatment of obesity and related comorbidities. Current BMI is Body mass index is 36.14 kg/m. [name] has been struggling with her weight for many years and has been unsuccessful in either losing weight, maintaining weight loss, or reaching her healthy weight goal.  [name] is currently in the action stage of change and ready to dedicate time achieving and maintaining a healthier weight. [name] is interested in becoming our patient and working on intensive lifestyle modifications including (but not limited to) diet and exercise for weight loss.  Weight history:  She was referred by: Specialist Dr. Seth Daniel   When asked what else they would like to accomplish? She states: Adopt healthier eating patterns and Improve energy levels and physical activity   When asked how has your weight affected you? She states: Having fatigue and Having poor endurance   Weight history: started to gain weight after diagnosis of hypothyroidism   Some associated conditions: None   Contributing factors: Family history of obesity and Consumption of processed foods   Weight promoting medications identified: None   Current nutrition plan: None   Current or previous pharmacotherapy: None   Response to medication: Never tried medications  Their highest weight has been:  208 lbs.  Desired weight: 145  Previous weight-loss programs : Vegetarian.  Their maximum weight loss was:  20 lbs.  Their greatest challenge with dieting: difficulty maintaining reduced calorie state.   Nutritional History:  Current nutrition plan: None.  How many times do you eat outside the home: 1-2 per week  How often do they skip meals: skips 1-2 meals a day  What beverages do they drink: caffeinated beverages , juice, sweet  tea , and alcohol, type: seltzer , 1 drinks per week.   Use of artificial sweetners : No  Food intolerances or dislikes: none.  Food triggers: Boredom and Seeking reward.  Food cravings: Sugary  Do they struggle with excessive hunger or portion control : No    Physical Activity:  Current level of physical activity: Walking 30-60 minutes 7 days a week with her son  Barriers to Exercise: none   Past medical history includes:   Past Medical History:  Diagnosis Date   Abnormal TSH 2018   Anemia    Blood transfusion without reported diagnosis 2019   due to anemia   Eczema    History of group B Streptococcus (GBS) infection    positive with pregnancy at 36 weeks   Hypothyroidism    Pregnancy induced hypertension    UTI (urinary tract infection)      Objective   BP 126/84   Pulse 66   Temp 97.9 F (36.6 C)   Ht 5\' 3"  (1.6 m)   Wt 204 lb (92.5 kg)   LMP 04/03/2024 (Approximate)   SpO2 100%   BMI 36.14 kg/m  She was weighed on the bioimpedance scale: Body mass index is 36.14 kg/m.    Anthropometrics:  Vitals Temp: 97.9 F (36.6 C) BP: 126/84 Pulse Rate: 66 SpO2: 100 %   Anthropometric Measurements Height: 5\' 3"  (1.6 m) Weight: 204 lb (92.5 kg) BMI (Calculated): 36.15 Starting Weight: 204 lb Peak Weight: 205 lb Waist Measurement : 40 inches   Body Composition  Body Fat %: 43.4 % Fat Mass (lbs): 88.6 lbs Muscle Mass (  lbs): 109.8 lbs Total Body Water (lbs): 78.2 lbs Visceral Fat Rating : 9   Other Clinical Data RMR: 1728 Fasting: yes Labs: yes Today's Visit #: 1 Starting Date: 04/07/24    Physical Exam:  General: She is overweight, cooperative, alert, well developed, and in no acute distress. PSYCH: Has normal mood, affect and thought process.   HEENT: EOMI, sclerae are anicteric. Lungs: Normal breathing effort, no conversational dyspnea. Extremities: No edema.  Neurologic: No gross sensory or motor deficits. No tremors or  fasciculations noted.    Diagnostic Data Reviewed  EKG: Normal sinus rhythm, rate 69 bpm. No conduction abnormalities, abnormal Q waves or chamber enlargement.  Indirect Calorimeter completed today shows a VO2 of 251 and a REE of 1728.  Her calculated basal metabolic rate is 1610 thus her resting energy expenditure same as calculated.  Depression Screen  Natalynn's PHQ-9 score was: 3.     03/18/2023    2:40 PM  Depression screen PHQ 2/9  Decreased Interest 0  Down, Depressed, Hopeless 0  PHQ - 2 Score 0  Altered sleeping 0  Tired, decreased energy 1  Change in appetite 1  Feeling bad or failure about yourself  0  Trouble concentrating 0  Moving slowly or fidgety/restless 0  Suicidal thoughts 0  PHQ-9 Score 2    Screening for Sleep Related Breathing Disorders  Keyetta denies daytime somnolence and admits to waking up still tired. Patient has a history of symptoms of morning headache. Nira generally gets 6 hours of sleep per night, and states that she has generally restful sleep. Snoring are not present. Apneic episodes are not present. Epworth Sleepiness Score is 4.   BMET    Component Value Date/Time   NA 135 02/24/2024 0350   NA 137 03/20/2023 1435   K 3.8 02/24/2024 0350   CL 106 02/24/2024 0350   CO2 20 (L) 02/24/2024 0350   GLUCOSE 143 (H) 02/24/2024 0350   BUN 6 02/24/2024 0350   BUN 10 03/20/2023 1435   CREATININE 0.53 02/24/2024 0350   CALCIUM 8.8 (L) 02/24/2024 0350   GFRNONAA >60 02/24/2024 0350   Lab Results  Component Value Date   HGBA1C 5.5 01/03/2023   No results found for: "INSULIN" CBC    Component Value Date/Time   WBC 11.9 (H) 02/26/2024 0646   RBC 3.47 (L) 02/26/2024 0646   HGB 8.9 (L) 02/26/2024 0646   HGB 12.7 03/20/2023 1435   HCT 27.4 (L) 02/26/2024 0646   HCT 37.8 03/20/2023 1435   PLT 322 02/26/2024 0646   PLT 201 03/20/2023 1435   MCV 79.0 (L) 02/26/2024 0646   MCV 89 03/20/2023 1435   MCH 25.6 (L) 02/26/2024 0646   MCHC 32.5  02/26/2024 0646   RDW 18.2 (H) 02/26/2024 0646   RDW 16.5 (H) 03/20/2023 1435   Iron /TIBC/Ferritin/ %Sat No results found for: "IRON ", "TIBC", "FERRITIN", "IRONPCTSAT" Lipid Panel  No results found for: "CHOL", "TRIG", "HDL", "CHOLHDL", "VLDL", "LDLCALC", "LDLDIRECT" Hepatic Function Panel     Component Value Date/Time   PROT 7.3 02/24/2024 0350   PROT 6.8 03/20/2023 1435   ALBUMIN  3.8 02/24/2024 0350   ALBUMIN  4.0 03/20/2023 1435   AST 21 02/24/2024 0350   ALT 17 02/24/2024 0350   ALKPHOS 62 02/24/2024 0350   BILITOT 0.3 02/24/2024 0350   BILITOT 0.2 03/20/2023 1435      Component Value Date/Time   TSH 9.69 (H) 12/22/2023 0802     Assessment and Plan   TREATMENT PLAN FOR  OBESITY:  Recommended Dietary Goals  Mliss is currently in the action stage of change. As such, her goal is to implement medically supervised weight loss plan.  She has agreed to implement: the Category 2 plan - 1200 kcal per day  Behavioral Intervention  We discussed the following Behavioral Modification Strategies today: increasing lean protein intake to established goals, decreasing simple carbohydrates , increasing vegetables, increasing lower glycemic fruits, increasing fiber rich foods, avoiding skipping meals, increasing water intake, work on meal planning and preparation, reading food labels , keeping healthy foods at home, identifying sources and decreasing liquid calories, decreasing eating out or consumption of processed foods, and making healthy choices when eating convenient foods, planning for success, and better snacking choices  Additional resources provided today: Handout on healthy eating and balanced plate, Handout on complex carbohydrates and lean sources of protein, and Category 2 packet  Recommended Physical Activity Goals  Missi has been advised to work up to 150 minutes of moderate intensity aerobic activity a week and strengthening exercises 2-3 times per week for cardiovascular  health, weight loss maintenance and preservation of muscle mass.   She has agreed to :  continue to gradually increase the amount and intensity of exercise routine  Pharmacotherapy We will work on building a Therapist, art and behavioral strategies. We will discuss the role of pharmacotherapy as an adjunct at subsequent visits.   ASSOCIATED CONDITIONS ADDRESSED TODAY  Other Fatigue Grisell denies daytime somnolence and admits to waking up still tired. Patient has a history of symptoms of morning headache. Kierra generally gets 6 hours of sleep per night, and states that she has generally restful sleep. Snoring are not present. Apneic episodes are not present. Epworth Sleepiness Score is 4. . Arleene does feel that her weight is causing her energy to be lower than it should be. Fatigue may be related to obesity, depression or many other causes. Labs will be ordered, and in the meanwhile, Jolisa will focus on self care including making healthy food choices, increasing physical activity and focusing on stress reduction.   Other fatigue -     EKG 12-Lead  Depression screen    Follow-up  She was informed of the importance of frequent follow-up visits to maximize her success with intensive lifestyle modifications for her multiple health conditions. She was informed we would discuss her lab results at her next visit unless there is a critical issue that needs to be addressed sooner. Kiasia agreed to keep her next visit at the agreed upon time to discuss these results.  Attestation Statement  This is the patient's intake visit at [facility]. The patient's Health Questionnaire was reviewed at length. Included in the packet: current and past health history, medications, allergies, ROS, gynecologic history (women only), surgical history, family history, social history, weight history, weight loss surgery history (for those that have had weight loss surgery), nutritional  evaluation, mood and food questionnaire, PHQ9, Epworth questionnaire, sleep habits questionnaire, patient life and health improvement goals questionnaire. These will all be scanned into the patient's chart under media.   During the visit, I independently reviewed the patient's EKG, previous labs, bioimpedance scale results, and indirect calorimetry results. I used this information to medically tailor a meal plan for the patient that will help her to lose weight and will improve her obesity-related conditions. I performed a medically necessary appropriate examination and/or evaluation. I discussed the assessment and treatment plan with the patient. The patient was provided an opportunity to ask  questions and all were answered. The patient agreed with the plan and demonstrated an understanding of the instructions. Labs were ordered at this visit and will be reviewed at the next visit unless critical results need to be addressed immediately. Clinical information was updated and documented in the EMR.   In addition, they received basic education on identification of processed foods and reduction of these, different sources of lean proteins and complex carbohydrates and how to eat balanced by incorporation of whole foods.  Reviewed by clinician on day of visit: allergies, medications, problem list, medical history, surgical history, family history, social history, and previous encounter notes.  I have spent 61 minutes in the care of the patient today including: 3 minutes before the visit reviewing and preparing the chart. 45 minutes face-to-face assessing and reviewing listed medical problems as outlined in obesity care plan, providing nutritional and behavioral counseling on topics outlined in the obesity care plan, independently interpreting test results and goals of care, as described in assessment and plan, reviewing and discussing biometric information and progress, reviewing latest PCP notes and  specialist consultations, and ordering diagnostics - see orders 13 minutes after the visit updating chart and documentation of encounter.    Ladd Picker, MD

## 2024-04-07 NOTE — Assessment & Plan Note (Signed)
 Peak Weight: 208 Start Date: April 07, 2024 Starting Weight : 204  Current Weight: 204 Working BMR: 1700 Weight loss goal: 145 pounds Current Nutritional: the Category 2 plan - 1200 kcal per day Past Nutritional: Tried plant-based diet in the past Past Pharmacotherapy: None Current Pharmacotherapy: None Contributing factors: family history of obesity, consumption of processed foods, and chronic skipping of meals and hypothyroidism Emotional eating tendencies present : Yes, boredom and stress Screened for sleep apnea in past : Yes Epworth of 4 Associated comorbid conditions: Other: Hypothyroidism  Last updated: June 2025 by : EM

## 2024-04-08 LAB — LIPID PANEL WITH LDL/HDL RATIO
Cholesterol, Total: 153 mg/dL (ref 100–199)
HDL: 37 mg/dL — ABNORMAL LOW (ref >39)
LDL Chol Calc (NIH): 99 mg/dL (ref 0–99)
LDL/HDL Ratio: 2.7 ratio (ref 0.0–3.2)
Triglycerides: 91 mg/dL (ref 0–149)
VLDL Cholesterol Cal: 17 mg/dL (ref 5–40)

## 2024-04-08 LAB — COMPREHENSIVE METABOLIC PANEL WITH GFR
ALT: 15 IU/L (ref 0–32)
AST: 18 IU/L (ref 0–40)
Albumin: 4.4 g/dL (ref 4.0–5.0)
Alkaline Phosphatase: 113 IU/L (ref 44–121)
BUN/Creatinine Ratio: 19 (ref 9–23)
BUN: 10 mg/dL (ref 6–20)
Bilirubin Total: 0.3 mg/dL (ref 0.0–1.2)
CO2: 21 mmol/L (ref 20–29)
Calcium: 9.3 mg/dL (ref 8.7–10.2)
Chloride: 102 mmol/L (ref 96–106)
Creatinine, Ser: 0.53 mg/dL — ABNORMAL LOW (ref 0.57–1.00)
Globulin, Total: 3 g/dL (ref 1.5–4.5)
Glucose: 87 mg/dL (ref 70–99)
Potassium: 4.3 mmol/L (ref 3.5–5.2)
Sodium: 140 mmol/L (ref 134–144)
Total Protein: 7.4 g/dL (ref 6.0–8.5)
eGFR: 130 mL/min/{1.73_m2} (ref >59)

## 2024-04-08 LAB — INSULIN, RANDOM: INSULIN: 12.7 u[IU]/mL (ref 2.6–24.9)

## 2024-04-08 LAB — VITAMIN D 25 HYDROXY (VIT D DEFICIENCY, FRACTURES): Vit D, 25-Hydroxy: 11.2 ng/mL — ABNORMAL LOW (ref 30.0–100.0)

## 2024-04-08 LAB — HEMOGLOBIN A1C
Est. average glucose Bld gHb Est-mCnc: 103 mg/dL
Hgb A1c MFr Bld: 5.2 % (ref 4.8–5.6)

## 2024-04-08 LAB — VITAMIN B12: Vitamin B-12: 380 pg/mL (ref 232–1245)

## 2024-04-21 ENCOUNTER — Encounter (INDEPENDENT_AMBULATORY_CARE_PROVIDER_SITE_OTHER): Payer: Self-pay | Admitting: Internal Medicine

## 2024-04-21 ENCOUNTER — Ambulatory Visit (INDEPENDENT_AMBULATORY_CARE_PROVIDER_SITE_OTHER): Payer: Medicaid Other | Admitting: Internal Medicine

## 2024-04-21 ENCOUNTER — Encounter: Payer: Self-pay | Admitting: Internal Medicine

## 2024-04-21 ENCOUNTER — Ambulatory Visit (INDEPENDENT_AMBULATORY_CARE_PROVIDER_SITE_OTHER): Admitting: Internal Medicine

## 2024-04-21 VITALS — BP 125/86 | HR 66 | Temp 97.7°F | Ht 63.0 in | Wt 204.0 lb

## 2024-04-21 VITALS — BP 120/80 | HR 71 | Ht 63.0 in | Wt 205.0 lb

## 2024-04-21 DIAGNOSIS — E786 Lipoprotein deficiency: Secondary | ICD-10-CM | POA: Insufficient documentation

## 2024-04-21 DIAGNOSIS — E559 Vitamin D deficiency, unspecified: Secondary | ICD-10-CM | POA: Diagnosis not present

## 2024-04-21 DIAGNOSIS — E66812 Obesity, class 2: Secondary | ICD-10-CM

## 2024-04-21 DIAGNOSIS — E88819 Insulin resistance, unspecified: Secondary | ICD-10-CM | POA: Diagnosis not present

## 2024-04-21 DIAGNOSIS — E063 Autoimmune thyroiditis: Secondary | ICD-10-CM

## 2024-04-21 DIAGNOSIS — Z6836 Body mass index (BMI) 36.0-36.9, adult: Secondary | ICD-10-CM

## 2024-04-21 MED ORDER — LEVOTHYROXINE SODIUM 125 MCG PO TABS: 125.0000 ug | ORAL_TABLET | Freq: Every day | ORAL | 3 refills | Status: AC

## 2024-04-21 MED ORDER — VITAMIN D (ERGOCALCIFEROL) 1.25 MG (50000 UNIT) PO CAPS: 50000.0000 [IU] | ORAL_CAPSULE | ORAL | 0 refills | Status: AC

## 2024-04-21 NOTE — Assessment & Plan Note (Signed)
 She has maintained her weight despite adhering to a 1200 calorie nutrition plan 90% of the time, tracking calories, consuming whole foods, and exercising 4-5 days a week.  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 The goal is to achieve a weight loss of 0.5 to 1 pound per week, considering her genetic background. - Add a meal replacement for one meal daily, preferably a protein shake. - Maintain calorie intake under 1200, closer to 1000 if possible. - Increase physical activity to 150 minutes of cardio per week and incorporate strengthening exercises 2-3 times a week. - Reassess in three weeks to evaluate progress. -We discussed the role of GLP-1 therapy and patient is not interested in present time

## 2024-04-21 NOTE — Patient Instructions (Signed)

## 2024-04-21 NOTE — Assessment & Plan Note (Signed)
 She has a normal LDL and triglycerides but a low HDL this could be genetic or related to her insulin  resistance.  She does incorporate healthy fats into her diet and has been advised to increase volume of physical activity.  She has also reduced simple and added sugars as well.

## 2024-04-21 NOTE — Progress Notes (Signed)
 Office: [phone]  /  Fax: [phone]  Weight Summary And Biometrics  Vitals Temp: 97.7 F (36.5 C) BP: 125/86 Pulse Rate: 66 SpO2: 99 %   Anthropometric Measurements Height: 5' 3 (1.6 m) Weight: 204 lb (92.5 kg) BMI (Calculated): 36.15 Weight at Last Visit: 204 lb Weight Lost Since Last Visit: 0 lb Weight Gained Since Last Visit: 0 lb Starting Weight: 204 lb Total Weight Loss (lbs): 0 lb (0 kg) Peak Weight: 2305 lb   Body Composition  Body Fat %: 43.3 % Fat Mass (lbs): 88.6 lbs Muscle Mass (lbs): 110 lbs Total Body Water (lbs): 80.6 lbs Visceral Fat Rating : 9    RMR: 1728  Today's Visit #: 2  Starting Date: 04/07/24   Subjective   Chief Complaint: Obesity  Interval History  Discussed the use of AI scribe software for clinical note transcription with the patient, who gave verbal consent to proceed.  History of Present Illness   Joann Fleming is a 28 year old female who presents for a post intake follow-up for medical weight management.  She has maintained her weight since the last office visit, adhering to a 1200 calorie nutrition plan 90% of the time. Her diet includes whole foods, adequate protein, and hydration, with no skipped meals. She exercises four to five days a week for 30 to 45 minutes, primarily cardio. She reports adequate sleep but sometimes feels tired, despite denying high levels of stress.  Her typical daily diet consists of cottage cheese with sliced peaches and two hard-boiled eggs for breakfast, smoked spring greens with four ounces of chicken, tomatoes, and avocado for lunch, a sliced apple for a snack, and roasted sweet potato, broccoli, and four to five ounces of ground [place] for dinner. She has eliminated sugary drinks, consuming only water, which she feels has reduced bloating and increased energy levels, although fatigue persists at times.  She had an ectopic pregnancy in April and underwent surgery on April 22. She is not  on birth control but uses condoms consistently and is not trying to conceive.  She sometimes forgets to take her levothyroxine , which may affect her weight.  Her recent blood work showed normal kidney function, liver enzymes, fasting blood sugar, and A1c levels, but low HDL cholesterol and vitamin D  deficiency. She has a family history of diabetes and has noticed darkening of the skin on her neck.  She uses MyFitnessPal to track her calories, staying under her calorie goal most days. She occasionally uses a protein shake as a meal replacement, especially on workdays with limited time for meals.        Challenges affecting patient progress: Blunted initial response to implementation of reduced calorie nutritional plan.    Pharmacotherapy for weight management: She is currently taking no anti-obesity medication and patient is not interested in pharmacotherapy.   Assessment and Plan   Treatment Plan For Obesity:  Recommended Dietary Goals  [name] is currently in the action stage of change. As such, her goal is to continue weight management plan. She has agreed to: continue current plan and +1 meal replacement per day  Behavioral Health and Counseling  We discussed the following behavioral modification strategies today: continue to work on maintaining a reduced calorie state, getting the recommended amount of protein, incorporating whole foods, making healthy choices, staying well hydrated and practicing mindfulness when eating..  Additional education and resources provided today: None  Recommended Physical Activity Goals  [name] has been advised to work up to 150 minutes  of moderate intensity aerobic activity a week and strengthening exercises 2-3 times per week for cardiovascular health, weight loss maintenance and preservation of muscle mass.   She has agreed to :  Start strengthening exercises with a goal of 2-3 sessions a week   Pharmacotherapy  We discussed various medication  options to help [name] with her weight loss efforts and we both agreed to : Has declined pharmacotherapy  Associated Conditions Impacted by Obesity Treatment  Vitamin D  deficiency Assessment & Plan: Most recent vitamin D  levels  Lab Results  Component Value Date   VD25OH 11.2 (L) 04/07/2024     Deficiency state associated with adiposity and may result in leptin resistance, weight gain and fatigue.   Plan: After discussion of benefits, alternative treatment options and side effects patient will be started on vitamin D2 50,000 units 1 tablet weekly for 3-4 months. for a treatment goal level of 50-60 mg/dl. Check levels at that time for response monitoring.   Orders: -     Vitamin D  (Ergocalciferol ); Take 1 capsule (50,000 Units total) by mouth every 7 (seven) days.  Dispense: 16 capsule; Refill: 0  Insulin  resistance Assessment & Plan: Her HOMA-IR is 2.7 to which is elevated. Optimal level < 1.9.  She also has acanthosis nigricans on the neck.  This is complex condition associated with genetics, ectopic fat and lifestyle factors. Insulin  resistance may result in increased fat storage, inhibition of the breakdown of fat, cause fluctuations in blood sugar leading to energy crashes and increased cravings for sugary or high carb foods and cause metabolic slowdown making it difficult to lose weight.  This may result in additional weight gain and lead to pre-diabetes and diabetes if untreated. In addition, hyperinsulinemia increases cardiovascular risk, chronic inflammatory response and may increase the risk of obesity related malignancies.  Lab Results  Component Value Date   HGBA1C 5.2 04/07/2024   Lab Results  Component Value Date   INSULIN  12.7 04/07/2024   Lab Results  Component Value Date   GLUCOSE 87 04/07/2024   GLUCOSE 78 02/18/2023    We reviewed treatment options which include losing 7 to 10% of body weight, increasing volume of physical activity and maintaining a diet low  in saturated fats and with a low glycemic load.  Patient has also been educated on the carb insulin  model of obesity.  She may also be a candidate for pharmacoprophylaxis with metformin and / or GLP1 medication.  At present time she declines pharmacotherapy    Class 2 obesity without serious comorbidity with body mass index (BMI) of 35.0 to 35.9 in adult, unspecified obesity type Assessment & Plan: She has maintained her weight despite adhering to a 1200 calorie nutrition plan 90% of the time, tracking calories, consuming whole foods, and exercising 4-5 days a week.  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 The goal is to achieve a weight loss of 0.5 to 1 pound per week, considering her genetic background. - Add a meal replacement for one meal daily, preferably a protein shake. - Maintain calorie intake under 1200, closer to 1000 if possible. - Increase physical activity to 150 minutes of cardio per week and incorporate strengthening exercises 2-3 times a week. - Reassess in three weeks to evaluate progress. -We discussed the role of GLP-1 therapy and patient is not interested in present time   Hypothyroidism due to Hashimoto thyroiditis Assessment & Plan: She occasionally forgets to take levothyroxine , which may affect her metabolism and weight  management. Consistent medication adherence is emphasized. - Ensure consistent daily intake of levothyroxine  on an empty stomach.   Low HDL (under 40) Assessment & Plan: She has a normal LDL and triglycerides but a low HDL this could be genetic or related to her insulin  resistance.  She does incorporate healthy fats into her diet and has been advised to increase volume of physical activity.  She has also reduced simple and added sugars as well.      General Health Maintenance Advised on general health maintenance, including hydration, physical activity, and dietary balance. - Maintain hydration  with 90 ounces of water per day. - Continue balanced diet with adequate protein, vegetables, and limited carbohydrates. - Monitor physical activity levels, aiming for 5000-10000 steps per day.  Follow-up She will be reassessed in three weeks to evaluate the effectiveness of dietary and exercise modifications. - Schedule follow-up appointment in three weeks.        Objective   Physical Exam:  Blood pressure 125/86, pulse 66, temperature 97.7 F (36.5 C), height 5' 3 (1.6 m), weight 204 lb (92.5 kg), last menstrual period 04/03/2024, SpO2 99%, unknown if currently breastfeeding. Body mass index is 36.14 kg/m.  General: She is overweight, cooperative, alert, well developed, and in no acute distress. PSYCH: Has normal mood, affect and thought process.   HEENT: EOMI, sclerae are anicteric. Lungs: Normal breathing effort, no conversational dyspnea. Extremities: No edema.  Neurologic: No gross sensory or motor deficits. No tremors or fasciculations noted.    Diagnostic Data Reviewed:  BMET    Component Value Date/Time   NA 140 04/07/2024 0904   K 4.3 04/07/2024 0904   CL 102 04/07/2024 0904   CO2 21 04/07/2024 0904   GLUCOSE 87 04/07/2024 0904   GLUCOSE 143 (H) 02/24/2024 0350   BUN 10 04/07/2024 0904   CREATININE 0.53 (L) 04/07/2024 0904   CALCIUM 9.3 04/07/2024 0904   GFRNONAA >60 02/24/2024 0350   Lab Results  Component Value Date   HGBA1C 5.2 04/07/2024   HGBA1C 5.5 01/03/2023   Lab Results  Component Value Date   INSULIN  12.7 04/07/2024   Lab Results  Component Value Date   TSH 9.69 (H) 12/22/2023   CBC    Component Value Date/Time   WBC 11.9 (H) 02/26/2024 0646   RBC 3.47 (L) 02/26/2024 0646   HGB 8.9 (L) 02/26/2024 0646   HGB 12.7 03/20/2023 1435   HCT 27.4 (L) 02/26/2024 0646   HCT 37.8 03/20/2023 1435   PLT 322 02/26/2024 0646   PLT 201 03/20/2023 1435   MCV 79.0 (L) 02/26/2024 0646   MCV 89 03/20/2023 1435   MCH 25.6 (L) 02/26/2024 0646   MCHC  32.5 02/26/2024 0646   RDW 18.2 (H) 02/26/2024 0646   RDW 16.5 (H) 03/20/2023 1435   Iron  Studies No results found for: IRON , TIBC, FERRITIN, IRONPCTSAT Lipid Panel     Component Value Date/Time   CHOL 153 04/07/2024 0904   TRIG 91 04/07/2024 0904   HDL 37 (L) 04/07/2024 0904   LDLCALC 99 04/07/2024 0904   Hepatic Function Panel     Component Value Date/Time   PROT 7.4 04/07/2024 0904   ALBUMIN  4.4 04/07/2024 0904   AST 18 04/07/2024 0904   ALT 15 04/07/2024 0904   ALKPHOS 113 04/07/2024 0904   BILITOT 0.3 04/07/2024 0904      Component Value Date/Time   TSH 9.69 (H) 12/22/2023 0802   Nutritional Lab Results  Component Value Date   VD25OH  11.2 (L) 04/07/2024    Medications: Outpatient Encounter Medications as of 04/21/2024  Medication Sig   Vitamin D , Ergocalciferol , (DRISDOL) 1.25 MG (50000 UNIT) CAPS capsule Take 1 capsule (50,000 Units total) by mouth every 7 (seven) days.   calcium carbonate (TUMS - DOSED IN MG ELEMENTAL CALCIUM) 500 MG chewable tablet Chew 1 tablet by mouth as needed for indigestion or heartburn. (Patient not taking: Reported on 04/21/2024)   ferrous sulfate  325 (65 FE) MG tablet Take 1 tablet (325 mg total) by mouth every other day.   fexofenadine (ALLEGRA) 180 MG tablet Take 180 mg by mouth as needed for allergies or rhinitis. (Patient not taking: Reported on 04/21/2024)   levothyroxine  (SYNTHROID ) 125 MCG tablet Take 1 tablet (125 mcg total) by mouth daily.   [DISCONTINUED] levothyroxine  (SYNTHROID ) 125 MCG tablet Take 1 tablet (125 mcg total) by mouth daily.   No facility-administered encounter medications on file as of 04/21/2024.     Follow-Up   Return in about 3 weeks (around 05/12/2024) for For Weight Mangement with Dr. Allie Area.Aaron Aas She was informed of the importance of frequent follow up visits to maximize her success with intensive lifestyle modifications for her multiple health conditions.  Attestation Statement   Reviewed by  clinician on day of visit: allergies, medications, problem list, medical history, surgical history, family history, social history, and previous encounter notes.   I have spent 46 minutes in the care of the patient today including: 3 minutes before the visit reviewing and preparing the chart. 37 minutes face-to-face assessing and reviewing listed medical problems as outlined in obesity care plan, providing nutritional and behavioral counseling on topics outlined in the obesity care plan, counseling regarding anti-obesity medication as outlined in obesity care plan, independently interpreting test results and goals of care, as described in assessment and plan, reviewing and discussing biometric information and progress, and ordering medications - see orders 6 minutes after the visit updating chart and documentation of encounter.    Ladd Picker, MD

## 2024-04-21 NOTE — Assessment & Plan Note (Signed)
 She occasionally forgets to take levothyroxine , which may affect her metabolism and weight management. Consistent medication adherence is emphasized. - Ensure consistent daily intake of levothyroxine  on an empty stomach.

## 2024-04-21 NOTE — Progress Notes (Signed)
 Name: Joann Fleming  MRN/ DOB: [id number], 1996/09/03    Age/ Sex: 28 y.o., female     PCP:  [facility]   Reason for Endocrinology Evaluation: Hypothyroidism     Initial Endocrinology Clinic Visit: 09/04/2018    PATIENT IDENTIFIER: Joann Fleming is a 28 y.o., female with unremarkable past medical history . She has followed with [facility] since 09/04/2018 for consultative assistance with management of her Hypothyroidism.      HISTORICAL SUMMARY:   In 2018, during work up for severe headaches, she was found to have elevated TSH of 454 uIU/mL . At the time she was euthyroid without any c/o of weight gain, fatigue, constipation or dry skin. Her menses have always been regular. On repeat TSH here in our office (09/04/2018) her TSH was 4.95 uIU/mL  with normal prolactin level.   Her TSH increased to 18.46 uIU/mL in 12/2019 , we attempted to start LT-4 but did not get the message , repeat labs in 12/2020 TSH went down to 3.98 uIU/mL and we opted to hold off on any LT-4 replacement but this started 04/2021 with a TSH 21.22 uIU/mL    S/P NVD 03/2023 - Joann Fleming    I had referred her to the [facility] in 12/2023  SUBJECTIVE:    Today (04/21/2024):  Joann Fleming is here for a follow up on Hashimoto's disease.    She has been following up with hematology for iron  deficiency anemia, patient was prescribed Feraheme She follows with  [facility] She is s/p  left laparoscopy with ectopic pregnancy 02/2024  Weight has been stable She has not been consistent with levothyroxine   Denies local neck swelling  Denies palpitations  Denies tremors Denies constipation or diarrhea  She is not on OCP's anymore   Levothyroxine  125 mcg daily    HISTORY:  Past Medical History:  Past Medical History:  Diagnosis Date   Abnormal TSH 2018   Anemia    Blood transfusion without reported diagnosis 2019    due to anemia   Eczema    History of group B Streptococcus (GBS) infection    positive with pregnancy at 36 weeks   Hypothyroidism    Pregnancy induced hypertension    UTI (urinary tract infection)    Past Surgical History: Abnormal TSH  Social History:  reports that she has never smoked. She has never used smokeless tobacco. She reports that she does not currently use alcohol. She reports that she does not use drugs. Family History: family history includes Diabetes in her mother; Diabetes type II in her mother; Healthy in her father; Heart Problems in her mother; High Cholesterol in her mother; High blood pressure in her mother; Obesity in her mother.   HOME MEDICATIONS: Allergies as of 04/21/2024   No Known Allergies      Medication List        Accurate as of April 21, 2024  7:56 AM. If you have any questions, ask your nurse or doctor.          calcium carbonate 500 MG chewable tablet Commonly known as: TUMS - dosed in mg elemental calcium Chew 1 tablet by mouth as needed for indigestion or heartburn.   FeroSul 325 (65 Fe) MG tablet Generic drug: ferrous sulfate  Take 1 tablet (325 mg total) by mouth every other day.   fexofenadine 180 MG tablet Commonly known as: ALLEGRA Take 180 mg by mouth  as needed for allergies or rhinitis.   levothyroxine  125 MCG tablet Commonly known as: SYNTHROID  Take 1 tablet (125 mcg total) by mouth daily.          OBJECTIVE:   PHYSICAL EXAM: VS: BP 120/80 (BP Location: Left Arm, Patient Position: Sitting, Cuff Size: Normal)   Pulse 71   Ht 5' 3 (1.6 m)   Wt 205 lb (93 kg)   LMP 04/03/2024 (Approximate)   SpO2 97%   BMI 36.31 kg/m    EXAM: General: Pt appears well and is in NAD  Neck: General: Supple without adenopathy. Thyroid : Thyroid  size normal.  No goiter or nodules appreciated.  Lungs: Clear with good BS bilat   Heart: Auscultation: RRR.  Mental Status: Judgment, insight: Intact Orientation: Oriented to time,  place, and person Mood and affect: No depression, anxiety, or agitation     DATA REVIEWED:    Latest Reference Range & Units 12/22/23 08:02  TSH mIU/L 9.69 (H)  T4,Free(Direct) 0.8 - 1.8 ng/dL 0.9  (H): Data is abnormally high   Results for Crear, Joann Fleming (MRN [id number]) as of 12/10/2018 12:33  Ref. Range 12/07/2018 08:27  Thyroperoxidase Ab SerPl-aCnc Latest Ref Range: <9 IU/mL 410 (H)    ASSESSMENT / PLAN / RECOMMENDATIONS:   1.  Hashimoto's Thyroiditis :  -Patient with imperfect adherence to levothyroxine  over the past 2 months.  We have opted not to check her TFTs today, I have strongly encouraged the patient to obtain a pillbox again, we discussed symptoms of weight gain, fatigue, constipation with uncontrolled thyroid  disease -No local neck symptoms. - Preconception counseling done today, patient to increase levothyroxine  by 2 tablets 2 days out of the week and remain on 1 tablet the other 5 days of the week and to notify our office Medication   Continue levothyroxine  125 mcg daily   F/U in 4 months     Signed electronically by: Natale Bail, MD  [facility]  [facility] [address] [place] [place], [place] [address] Phone: [phone] FAX: [phone]      CC: El Gravely [address] [place], [place] [address] Phone: [phone] Fax: [phone]  Return to Endocrinology clinic as below: Future Appointments  Date Time Provider Department Center  04/21/2024 10:20 AM Ladd Picker, MD [facility] None

## 2024-04-21 NOTE — Assessment & Plan Note (Signed)
 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 Optimal level < 1.9.  She also has acanthosis nigricans on the neck.  This is complex condition associated with genetics, ectopic fat and lifestyle factors. Insulin  resistance may result in increased fat storage, inhibition of the breakdown of fat, cause fluctuations in blood sugar leading to energy crashes and increased cravings for sugary or high carb foods and cause metabolic slowdown making it difficult to lose weight.  This may result in additional weight gain and lead to pre-diabetes and diabetes if untreated. In addition, hyperinsulinemia increases cardiovascular risk, chronic inflammatory response and may increase the risk of obesity related malignancies.  Lab Results  Component Value Date   HGBA1C 5.2 04/07/2024   Lab Results  Component Value Date   INSULIN  12.7 04/07/2024   Lab Results  Component Value Date   GLUCOSE 87 04/07/2024   GLUCOSE 78 02/18/2023    We reviewed treatment options which include losing 7 to 10% of body weight, increasing volume of physical activity and maintaining a diet low in saturated fats and with a low glycemic load.  Patient has also been educated on the carb insulin  model of obesity.  She may also be a candidate for pharmacoprophylaxis with metformin and / or GLP1 medication.  At present time she declines pharmacotherapy

## 2024-04-21 NOTE — Assessment & Plan Note (Signed)
 Most recent vitamin D  levels  Lab Results  Component Value Date   VD25OH 11.2 (L) 04/07/2024     Deficiency state associated with adiposity and may result in leptin resistance, weight gain and fatigue.   Plan: After discussion of benefits, alternative treatment options and side effects patient will be started on vitamin D2 50,000 units 1 tablet weekly for 3-4 months. for a treatment goal level of 50-60 mg/dl. Check levels at that time for response monitoring.

## 2024-05-18 ENCOUNTER — Ambulatory Visit (INDEPENDENT_AMBULATORY_CARE_PROVIDER_SITE_OTHER): Admitting: Internal Medicine

## 2024-05-18 ENCOUNTER — Encounter (INDEPENDENT_AMBULATORY_CARE_PROVIDER_SITE_OTHER): Payer: Self-pay | Admitting: Internal Medicine

## 2024-05-18 VITALS — BP 127/84 | HR 80 | Temp 98.0°F | Ht 63.0 in | Wt 197.0 lb

## 2024-05-18 DIAGNOSIS — E88819 Insulin resistance, unspecified: Secondary | ICD-10-CM | POA: Diagnosis not present

## 2024-05-18 DIAGNOSIS — E66812 Obesity, class 2: Secondary | ICD-10-CM

## 2024-05-18 DIAGNOSIS — E7800 Pure hypercholesterolemia, unspecified: Secondary | ICD-10-CM | POA: Diagnosis not present

## 2024-05-18 DIAGNOSIS — Z6835 Body mass index (BMI) 35.0-35.9, adult: Secondary | ICD-10-CM | POA: Diagnosis not present

## 2024-05-18 NOTE — Progress Notes (Signed)
 Office: [phone]  /  Fax: [phone]  Weight Summary and Body Composition Analysis (BIA)  Vitals Temp: 98 F (36.7 C) BP: 127/84 Pulse Rate: 80 SpO2: 99 %   Anthropometric Measurements Height: 5' 3 (1.6 m) Weight: 197 lb (89.4 kg) BMI (Calculated): 34.91 Weight at Last Visit: 204 lb Weight Lost Since Last Visit: 7 lb Weight Gained Since Last Visit: 0 lb Starting Weight: 204 lb Total Weight Loss (lbs): 7 lb (3.175 kg) Peak Weight: 205 lb   Body Composition  Body Fat %: 42.3 % Fat Mass (lbs): 83.6 lbs Muscle Mass (lbs): 108.4 lbs Total Body Water (lbs): 77 lbs Visceral Fat Rating : 8    RMR: 1728  Today's Visit #: 3  Starting Date: 04/07/24   Subjective   Chief Complaint: Obesity  Interval History Discussed the use of AI scribe software for clinical note transcription with the patient, who gave verbal consent to proceed.  History of Present Illness   Joann Fleming is a 28 year old female who presents for medical weight management.  She is following a 1200 calorie nutrition plan and has successfully lost seven pounds. She tracks her food intake, incorporates more whole foods, ensures adequate protein intake, and maintains hydration. Despite these efforts, she experiences hunger, especially after meal replacements with protein shakes, which she uses for dinner when working nights.  She exercises four days a week for about 30 minutes each session. Despite her efforts, she feels hungry after consuming protein shakes and small side salads, which she uses as meal replacements. She has tried adding fruits like apples or small side salads to her meals but still feels hungry.  She uses MyFitnessPal to track her calorie intake, aiming to stay at 1100 calories or below, which she finds challenging as it leaves her feeling hungry and anxious. She has been using protein shakes, [place] yogurt with berries, and overnight oats with protein powder as part of her diet  regimen.  Despite these positive changes, she continues to struggle with hunger, particularly when replacing meals with protein shakes.  She uses Muscle Milk for her protein shakes, both in powder and premade forms. She also incorporates [place] yogurt with mixed berries and protein powder into her diet. She has tried adding chia seeds to her oatmeal for additional fiber and protein.       Challenges affecting patient progress: multiple competing priorities and strong hunger signals and/or impaired satiety / inhibitory control.    Pharmacotherapy for weight management: She is currently taking no anti-obesity medication and patient has declined pharmacotherapy in past.   Assessment and Plan   Treatment Plan For Obesity:  Recommended Dietary Goals  Joann Fleming is currently in the action stage of change. As such, her goal is to continue weight management plan. She has agreed to: keep a food journal with a target of  1200 calories per day and 90-120 grams of protein per day or 30-40 grams per meal.  Behavioral Health and Counseling  We discussed the following behavioral modification strategies today: work on tracking and journaling calories using tracking application and continue to work on maintaining a reduced calorie state, getting the recommended amount of protein, incorporating whole foods, making healthy choices, staying well hydrated and practicing mindfulness when eating..  Additional education and resources provided today: None  Recommended Physical Activity Goals  Joann Fleming has been advised to work up to 150 minutes of moderate intensity aerobic activity a week and strengthening exercises 2-3 times per week for cardiovascular health,  weight loss maintenance and preservation of muscle mass.   She has agreed to :  continue to gradually increase the amount and intensity of exercise routine  Medical Interventions and Pharmacotherapy  We discussed various medication options to help Joann Fleming  with her weight loss efforts and we both agreed to : Continue with current nutritional and behavioral strategies and Has declined pharmacotherapy  Associated Conditions Impacted by Obesity Treatment  Assessment & Plan Pure hypercholesterolemia I reviewed labs at [facility] she had an LDL just above 100 with normal triglycerides and HDL cholesterol.  She has family history of metabolic conditions, had hypertension induced by pregnancy so may be at higher risk for cardiovascular disease in the future.  Losing 10% of body weight may improve LDL cholesterol.  Also reducing saturated fats in her diet.  Check fasting lipid profile in October Class 2 obesity without serious comorbidity with body mass index (BMI) of 35.0 to 35.9 in adult, unspecified obesity type She has lost 7 pounds with bioimpedance information suggested improvements in SAT and VAT.  She is having some problems with hunger as she has been staying on the 1200 cal.  I advised to increase caloric intake to 12 100-13 100 add Chia seeds to her protein shake and increase the bulk and size of her salads at dinner. 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 Optimal level < 1.9.  She also has acanthosis nigricans on the neck.  This is complex condition associated with genetics, ectopic fat and lifestyle factors. Insulin  resistance may result in increased fat storage, inhibition of the breakdown of fat, cause fluctuations in blood sugar leading to energy crashes and increased cravings for sugary or high carb foods and cause metabolic slowdown making it difficult to lose weight.  This may result in additional weight gain and lead to pre-diabetes and diabetes if untreated. In addition, hyperinsulinemia increases cardiovascular risk, chronic inflammatory response and may increase the risk of obesity related malignancies.  Lab Results  Component Value Date   HGBA1C 5.2 04/07/2024   Lab Results  Component Value Date   INSULIN  12.7  04/07/2024   Lab Results  Component Value Date   GLUCOSE 87 04/07/2024   GLUCOSE 78 02/18/2023    We reviewed treatment options which include losing 7 to 10% of body weight, increasing volume of physical activity and maintaining a diet low in saturated fats and with a low glycemic load.  Patient has also been educated on the carb insulin  model of obesity.  She may also be a candidate for pharmacoprophylaxis with metformin and / or GLP1 medication.  At present time she declines pharmacotherapy  Continue with current weight management strategy.    Objective   Physical Exam:  Blood pressure 127/84, pulse 80, temperature 98 F (36.7 C), height 5' 3 (1.6 m), weight 197 lb (89.4 kg), last menstrual period 05/11/2024, SpO2 99%, unknown if currently breastfeeding. Body mass index is 34.9 kg/m.  General: She is overweight, cooperative, alert, well developed, and in no acute distress. PSYCH: Has normal mood, affect and thought process.   HEENT: EOMI, sclerae are anicteric. Lungs: Normal breathing effort, no conversational dyspnea. Extremities: No edema.  Neurologic: No gross sensory or motor deficits. No tremors or fasciculations noted.    Diagnostic Data Reviewed:  BMET    Component Value Date/Time   NA 140 04/07/2024 0904   K 4.3 04/07/2024 0904   CL 102 04/07/2024 0904   CO2 21 04/07/2024 0904   GLUCOSE 87 04/07/2024 0904  GLUCOSE 143 (H) 02/24/2024 0350   BUN 10 04/07/2024 0904   CREATININE 0.53 (L) 04/07/2024 0904   CALCIUM 9.3 04/07/2024 0904   GFRNONAA >60 02/24/2024 0350   Lab Results  Component Value Date   HGBA1C 5.2 04/07/2024   HGBA1C 5.5 01/03/2023   Lab Results  Component Value Date   INSULIN  12.7 04/07/2024   Lab Results  Component Value Date   TSH 9.69 (H) 12/22/2023   CBC    Component Value Date/Time   WBC 11.9 (H) 02/26/2024 0646   RBC 3.47 (L) 02/26/2024 0646   HGB 8.9 (L) 02/26/2024 0646   HGB 12.7 03/20/2023 1435   HCT 27.4 (L)  02/26/2024 0646   HCT 37.8 03/20/2023 1435   PLT 322 02/26/2024 0646   PLT 201 03/20/2023 1435   MCV 79.0 (L) 02/26/2024 0646   MCV 89 03/20/2023 1435   MCH 25.6 (L) 02/26/2024 0646   MCHC 32.5 02/26/2024 0646   RDW 18.2 (H) 02/26/2024 0646   RDW 16.5 (H) 03/20/2023 1435   Iron  Studies No results found for: IRON , TIBC, FERRITIN, IRONPCTSAT Lipid Panel     Component Value Date/Time   CHOL 153 04/07/2024 0904   TRIG 91 04/07/2024 0904   HDL 37 (L) 04/07/2024 0904   LDLCALC 99 04/07/2024 0904   Hepatic Function Panel     Component Value Date/Time   PROT 7.4 04/07/2024 0904   ALBUMIN  4.4 04/07/2024 0904   AST 18 04/07/2024 0904   ALT 15 04/07/2024 0904   ALKPHOS 113 04/07/2024 0904   BILITOT 0.3 04/07/2024 0904      Component Value Date/Time   TSH 9.69 (H) 12/22/2023 0802   Nutritional Lab Results  Component Value Date   VD25OH 11.2 (L) 04/07/2024    Medications: Outpatient Encounter Medications as of 05/18/2024  Medication Sig   calcium carbonate (TUMS - DOSED IN MG ELEMENTAL CALCIUM) 500 MG chewable tablet Chew 1 tablet by mouth as needed for indigestion or heartburn.   ferrous sulfate  325 (65 FE) MG tablet Take 1 tablet (325 mg total) by mouth every other day.   fexofenadine (ALLEGRA) 180 MG tablet Take 180 mg by mouth as needed for allergies or rhinitis.   levothyroxine  (SYNTHROID ) 125 MCG tablet Take 1 tablet (125 mcg total) by mouth daily.   Vitamin D , Ergocalciferol , (DRISDOL ) 1.25 MG (50000 UNIT) CAPS capsule Take 1 capsule (50,000 Units total) by mouth every 7 (seven) days.   No facility-administered encounter medications on file as of 05/18/2024.     Follow-Up   No follow-ups on file.SABRA She was informed of the importance of frequent follow up visits to maximize her success with intensive lifestyle modifications for her multiple health conditions.  Attestation Statement   Reviewed by clinician on day of visit: allergies, medications, problem list,  medical history, surgical history, family history, social history, and previous encounter notes.     Lucas Parker, MD

## 2024-05-18 NOTE — Assessment & Plan Note (Signed)
 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 Optimal level < 1.9.  She also has acanthosis nigricans on the neck.  This is complex condition associated with genetics, ectopic fat and lifestyle factors. Insulin  resistance may result in increased fat storage, inhibition of the breakdown of fat, cause fluctuations in blood sugar leading to energy crashes and increased cravings for sugary or high carb foods and cause metabolic slowdown making it difficult to lose weight.  This may result in additional weight gain and lead to pre-diabetes and diabetes if untreated. In addition, hyperinsulinemia increases cardiovascular risk, chronic inflammatory response and may increase the risk of obesity related malignancies.  Lab Results  Component Value Date   HGBA1C 5.2 04/07/2024   Lab Results  Component Value Date   INSULIN  12.7 04/07/2024   Lab Results  Component Value Date   GLUCOSE 87 04/07/2024   GLUCOSE 78 02/18/2023    We reviewed treatment options which include losing 7 to 10% of body weight, increasing volume of physical activity and maintaining a diet low in saturated fats and with a low glycemic load.  Patient has also been educated on the carb insulin  model of obesity.  She may also be a candidate for pharmacoprophylaxis with metformin and / or GLP1 medication.  At present time she declines pharmacotherapy

## 2024-05-18 NOTE — Assessment & Plan Note (Signed)
 I reviewed labs at [facility] she had an LDL just above 100 with normal triglycerides and HDL cholesterol.  She has family history of metabolic conditions, had hypertension induced by pregnancy so may be at higher risk for cardiovascular disease in the future.  Losing 10% of body weight may improve LDL cholesterol.  Also reducing saturated fats in her diet.  Check fasting lipid profile in October

## 2024-05-18 NOTE — Assessment & Plan Note (Signed)
 She has lost 7 pounds with bioimpedance information suggested improvements in SAT and VAT.  She is having some problems with hunger as she has been staying on the 1200 cal.  I advised to increase caloric intake to 12 100-13 100 add Chia seeds to her protein shake and increase the bulk and size of her salads at dinner.

## 2024-05-30 ENCOUNTER — Encounter (HOSPITAL_COMMUNITY): Payer: Self-pay | Admitting: *Deleted

## 2024-05-30 ENCOUNTER — Other Ambulatory Visit: Payer: Self-pay

## 2024-05-30 ENCOUNTER — Emergency Department (HOSPITAL_COMMUNITY)
Admission: EM | Admit: 2024-05-30 | Discharge: 2024-05-30 | Disposition: A | Discharge status: Home / Self Care | Attending: Emergency Medicine | Admitting: Emergency Medicine

## 2024-05-30 DIAGNOSIS — E039 Hypothyroidism, unspecified: Secondary | ICD-10-CM | POA: Insufficient documentation

## 2024-05-30 DIAGNOSIS — L0201 Cutaneous abscess of face: Secondary | ICD-10-CM | POA: Diagnosis present

## 2024-05-30 MED ORDER — LIDOCAINE-EPINEPHRINE 1 %-1:100000 IJ SOLN
10.0000 mL | Freq: Once | INTRAMUSCULAR | Status: DC
  Filled 2024-05-30: qty 1

## 2024-05-30 MED ORDER — DOXYCYCLINE HYCLATE 100 MG PO CAPS
100.0000 mg | ORAL_CAPSULE | Freq: Two times a day (BID) | ORAL | 0 refills | Status: AC
Start: 2024-05-30 — End: 2024-06-06

## 2024-05-30 NOTE — ED Notes (Signed)
 Discharge instructions reviewed.   Newly prescribed medications discussed. Pharmacy verified.   Opportunity for questions and concerns provided.   Alert, oriented and ambulatory. Displays no signs of distress.

## 2024-05-30 NOTE — Discharge Instructions (Signed)
 You were seen today for facial abscess/swelling. While you were here we monitored your vitals, performed a physical exam, and performed an incision & drainage of the abscess. These were all reassuring and there is no indication for any further testing or intervention in the emergency department at this time.   Things to do:  - Follow up with your primary care provider within the next 1-2 weeks, or sooner if your symptoms do not improve in the next few days - Keep your wound clean and dry and replace bandaid at least once daily. Apply bacitracin ointment to the wound when you change the bandaid. Do not go swimming or submerge your face under water while the wound is healing over the next week - Take doxycycline  antibiotic twice daily for the next week  Return to the emergency department if you have any new or worsening symptoms, or if you have any other serious medical concerns.

## 2024-05-30 NOTE — ED Provider Notes (Signed)
 [employer] EMERGENCY DEPARTMENT AT [facility] Provider Note   CSN: [id number] Arrival date & time: 05/30/24  2000   Patient presents with: face abscess   Joann Fleming is a 28 y.o. female with hypothyroidism, HLD, and prior ectopic pregnancy who presents for evaluation of a facial abscess. Patient states that she first noticed a deep cystic pimple on her left cheek on Tuesday last week, which has progressively grown and become increasingly tender. Earlier today she noted some spontaneous drainage after she woke up, but otherwise has not expressed anything from it. She endorses some mild pain of her left neck and fullness in her left ear but otherwise denies fevers/chills, N/V, nasal congestion/rhinorrhea, dental pain, or vision changes in the left eye. She has no history of similar prior lesions.    Prior to Admission medications   Medication Sig Start Date End Date Taking? Authorizing Provider  doxycycline  (VIBRAMYCIN ) 100 MG capsule Take 1 capsule (100 mg total) by mouth 2 (two) times daily for 7 days. 05/30/24 06/06/24 Yes Marchetta Navratil, MD  calcium carbonate (TUMS - DOSED IN MG ELEMENTAL CALCIUM) 500 MG chewable tablet Chew 1 tablet by mouth as needed for indigestion or heartburn.    [provider]  ferrous sulfate  325 (65 FE) MG tablet Take 1 tablet (325 mg total) by mouth every other day. 02/26/24   Zina Jerilynn LABOR, MD  fexofenadine (ALLEGRA) 180 MG tablet Take 180 mg by mouth as needed for allergies or rhinitis.    [provider]  levothyroxine  (SYNTHROID ) 125 MCG tablet Take 1 tablet (125 mcg total) by mouth daily. 04/21/24   Shamleffer, Ibtehal Jaralla, MD  Vitamin D , Ergocalciferol , (DRISDOL ) 1.25 MG (50000 UNIT) CAPS capsule Take 1 capsule (50,000 Units total) by mouth every 7 (seven) days. 04/21/24   Francyne Romano, MD    Allergies: Patient has no known allergies.      Updated Vital Signs BP 125/82   Pulse 70   Temp 98.5 F (36.9 C)    Resp 17   Ht 5' 3 (1.6 m)   Wt 89.4 kg   LMP 05/11/2024 (Approximate)   SpO2 100%   BMI 34.91 kg/m   Physical Exam Vitals reviewed.  Constitutional:      General: She is not in acute distress.    Appearance: She is not toxic-appearing or diaphoretic.  HENT:     Head: Normocephalic and atraumatic.     Left Ear: Tympanic membrane, ear canal and external ear normal.     Nose: Nose normal. No rhinorrhea.     Mouth/Throat:     Mouth: Mucous membranes are moist.     Pharynx: Oropharynx is clear.     Comments: No dental/gingival lesions or focal tenderness Eyes:     Extraocular Movements: Extraocular movements intact.     Conjunctiva/sclera: Conjunctivae normal.     Pupils: Pupils are equal, round, and reactive to light.  Cardiovascular:     Rate and Rhythm: Normal rate and regular rhythm.     Heart sounds: No murmur heard.    No gallop.  Pulmonary:     Effort: Pulmonary effort is normal. No respiratory distress.  Musculoskeletal:     Cervical back: Normal range of motion and neck supple. No rigidity or tenderness.  Lymphadenopathy:     Cervical: Cervical adenopathy (shotty left LAD) present.  Skin:    General: Skin is warm and dry.     Findings: Lesion (abscess of the left malar cheek with firm center  measuring appx 4.5 cm, no spontaneous drainage. Contained to the cheek with no tracking down the neck) present.  Neurological:     Mental Status: She is alert and oriented to person, place, and time. Mental status is at baseline.     Sensory: No sensory deficit.     (all labs ordered are listed, but only abnormal results are displayed) Labs Reviewed - No data to display  EKG: None  Radiology: No results found.   .Incision and Drainage  Date/Time: 05/31/2024 2:22 AM  Performed by: Raoul Rake, MD Authorized by: Lenor Hollering, MD   Consent:    Consent obtained:  Verbal   Consent given by:  Patient   Risks, benefits, and alternatives were discussed: yes      Risks discussed:  Bleeding, incomplete drainage, infection and pain   Alternatives discussed:  No treatment Location:    Type:  Abscess   Size:  4.5   Location:  Head   Head location:  Face Pre-procedure details:    Skin preparation:  Povidone-iodine  Sedation:    Sedation type:  None Anesthesia:    Anesthesia method:  Local infiltration   Local anesthetic:  Lidocaine  1% WITH epi Procedure type:    Complexity:  Simple Procedure details:    Ultrasound guidance: ultrasound performed prior to the procedure.     Needle aspiration: no     Incision types:  Stab incision   Drainage:  Purulent and bloody   Drainage amount:  Moderate   Wound treatment:  Wound left open   Packing materials:  None Post-procedure details:    Procedure completion:  Tolerated well, no immediate complications    Medications Ordered in the ED  lidocaine -EPINEPHrine  (XYLOCAINE  W/EPI) 1 %-1:100000 (with pres) injection 10 mL (has no administration in time range)      Medical Decision Making Patient with the above history is presenting with left facial abscess on her malar cheek that has been worsening over the last 5 days. She has focal tenderness and swelling and some associated mild left lateral neck pain and left ear fullness with no noted abnormalities on exam of these areas. She otherwise has no systemic symptoms including fevers or N/V. Bedside ultrasound performed to identify a drainable abscess pocket. The abscess does not appear to track out of the skin of the malar cheek and there is no indication at this time for further imaging of the face.  Incision and drainage of the abscess was performed with moderate bloody/purulent drainage noted. Patient's wound was dressed and she was discharged in stable condition with prescription for doxycycline  course and thorough wound care instructions, strict return precautions, and recommended close PCP follow up in the next 1-2 weeks.   Risk Prescription drug  management.    Final diagnoses:  Facial abscess    ED Discharge Orders          Ordered    doxycycline  (VIBRAMYCIN ) 100 MG capsule  2 times daily        05/30/24 2142               Raoul Rake, MD 05/31/24 9770    Lenor Hollering, MD 06/02/24 [phone]

## 2024-06-21 ENCOUNTER — Other Ambulatory Visit

## 2024-06-22 ENCOUNTER — Encounter (INDEPENDENT_AMBULATORY_CARE_PROVIDER_SITE_OTHER): Payer: Self-pay | Admitting: Internal Medicine

## 2024-06-22 ENCOUNTER — Ambulatory Visit (INDEPENDENT_AMBULATORY_CARE_PROVIDER_SITE_OTHER): Admitting: Internal Medicine

## 2024-06-22 VITALS — BP 120/81 | HR 62 | Temp 98.6°F | Ht 63.0 in | Wt 198.0 lb

## 2024-06-22 DIAGNOSIS — R638 Other symptoms and signs concerning food and fluid intake: Secondary | ICD-10-CM | POA: Diagnosis not present

## 2024-06-22 DIAGNOSIS — Z6835 Body mass index (BMI) 35.0-35.9, adult: Secondary | ICD-10-CM

## 2024-06-22 DIAGNOSIS — E7800 Pure hypercholesterolemia, unspecified: Secondary | ICD-10-CM

## 2024-06-22 DIAGNOSIS — E66812 Obesity, class 2: Secondary | ICD-10-CM

## 2024-06-22 DIAGNOSIS — E88819 Insulin resistance, unspecified: Secondary | ICD-10-CM

## 2024-06-22 MED ORDER — WEGOVY 0.25 MG/0.5ML ~~LOC~~ SOAJ: 0.2500 mg | SUBCUTANEOUS | 0 refills | Status: DC

## 2024-06-22 NOTE — Assessment & Plan Note (Signed)
She has increased orexigenic signaling, impaired satiety and inhibitory control. 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  In addition to nutritional and behavioral strategies she benefits from pharmacotherapy.  She will be started on Wegovy 0.25 mg once a week

## 2024-06-22 NOTE — Progress Notes (Signed)
 Office: [phone]  /  Fax: [phone]  Weight Summary And Body Composition Analysis (BIA)  Vitals Temp: 98.6 F (37 C) BP: 120/81 Pulse Rate: 62 SpO2: 100 %   Anthropometric Measurements Height: 5' 3 (1.6 m) Weight: 198 lb (89.8 kg) BMI (Calculated): 35.08 Weight at Last Visit: 197 lb Weight Lost Since Last Visit: 0 lb Weight Gained Since Last Visit: 1 lb Starting Weight: 204 lb Total Weight Loss (lbs): 6 lb (2.722 kg) Peak Weight: 205 lb   Body Composition  Body Fat %: 42.4 % Fat Mass (lbs): 84 lbs Muscle Mass (lbs): 108.4 lbs Total Body Water (lbs): 77.4 lbs Visceral Fat Rating : 8    RMR: [id number]  Today's Visit #: 4  Starting Date: 04/08/23   Subjective   Chief Complaint: Obesity  Joann Fleming is here to discuss her progress with her obesity treatment plan. She is following the Category 2 plan - 1200 kcal per day and states she is following her eating plan approximately 70-80% of the time. She states she is exercising 30 minutes 4 times per week..  Weight Progress Since Last Visit:  Since last office visit she has gained 1 pounds.  She reports fair adherence to reduced calorie nutritional plan. She has been working on not skipping meals, increasing protein intake at every meal, eating more fruits, eating more vegetables, drinking more water, avoiding and / or reducing liquid calories, making healthier choices, reducing portion sizes, and incorporating more whole foods   Nutritional 24 HR Recall: Frequent lapse in nutrition due to vacationing.  Challenges affecting patient progress: strong hunger signals and/or impaired satiety / inhibitory control.   Orexigenic Control: Reports problems with appetite and hunger signals.  Reports problems with satiety and satiation.  Denies problems with eating patterns and portion control.  Denies abnormal cravings. Denies feeling deprived or restricted.   Pharmacotherapy for weight management: She is currently  taking no anti-obesity medication and patient has declined pharmacotherapy in past.   Assessment and Plan   Treatment Plan For Obesity:  Recommended Dietary Goals  Joann Fleming is currently in the action stage of change. As such, her goal is to continue weight management plan. She has agreed to: continue current plan  Behavioral Health and Counseling  We discussed the following behavioral modification strategies today: increasing lean protein intake to established goals, decreasing simple carbohydrates , increasing vegetables, increasing fiber rich foods, and increasing water intake .  Additional education and resources provided today: Handout guide to GLP-1 therapy and how to manage and avoid side effects  Recommended Physical Activity Goals  Joann Fleming has been advised to work up to 150 minutes of moderate intensity aerobic activity a week and strengthening exercises 2-3 times per week for cardiovascular health, weight loss maintenance and preservation of muscle mass.   She has agreed to :  continue to gradually increase the amount and intensity of exercise routine  Pharmacotherapy and Medical Interventions  Start anti-obesity medication.  In addition to reduced calorie nutrition plan (RCNP), behavioral strategies and physical activity, Joann Fleming would benefit from pharmacotherapy to assist with hunger signals, satiety and cravings. This will reduce obesity-related health risks by inducing weight loss, and help reduce food consumption and adherence to [facility]) . It may also improve QOL by improving self-confidence and reduce the  setbacks associated with metabolic adaptations.  Patient has a constellation of metabolic and cardiovascular risk factors associated with her obesity that significantly elevate their risk for adverse outcomes.  She also has strong orixegenic signaling and would  benefit from GLP-1 aided weight loss.  After discussion of treatment options, mechanisms of action, benefits, side  effects, contraindications and shared decision making she is agreeable to starting Wegovy  0.25 mg once a week. Patient also made aware that medication is indicated for long-term management of obesity and the risk of weight regain following discontinuation of treatment and hence the importance of adhering to medical weight loss plan.  We demonstrated use of device and patient using teach back method was able to demonstrate proper technique.  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 Optimal level < 1.9.  She also has acanthosis nigricans on the neck.  This is complex condition associated with genetics, ectopic fat and lifestyle factors. Insulin  resistance may result in increased fat storage, inhibition of the breakdown of fat, cause fluctuations in blood sugar leading to energy crashes and increased cravings for sugary or high carb foods and cause metabolic slowdown making it difficult to lose weight.  This may result in additional weight gain and lead to pre-diabetes and diabetes if untreated. In addition, hyperinsulinemia increases cardiovascular risk, chronic inflammatory response and may increase the risk of obesity related malignancies.  Lab Results  Component Value Date   HGBA1C 5.2 04/07/2024   Lab Results  Component Value Date   INSULIN  12.7 04/07/2024   Lab Results  Component Value Date   GLUCOSE 87 04/07/2024   GLUCOSE 78 02/18/2023    We reviewed treatment options which include losing 7 to 10% of body weight, increasing volume of physical activity and maintaining a diet low in saturated fats and with a low glycemic load.  Patient has also been educated on the carb insulin  model of obesity.  Patient is now open to pharmacotherapy.  I feel that she would be a good candidate for GLP-1 aided weight loss.  She will be started on Wegovy  for pharmacoprophylaxis.  Class 2 obesity without serious  comorbidity with body mass index (BMI) of 35.0 to 35.9 in adult, unspecified obesity type Send starting program she has lost 6 pounds.  She has had a recent lapse in her nutrition plan due to family and travel.  She also has problems with hunger control and appears to have a hungry gut phenotype.  She benefits from increasing fiber, protein intake to delay gastric emptying.  GLP-1 would also be of benefit.  After discussion of benefits and side effects he is agreeable to starting Wegovy  0.25 mg once a week Pure hypercholesterolemia I reviewed labs at [facility] she had an LDL just above 100 with normal triglycerides and HDL cholesterol.  She has family history of metabolic conditions, had hypertension induced by pregnancy so may be at higher risk for cardiovascular disease in the future.  Losing 10% of body weight may improve LDL cholesterol.  Also reducing saturated fats in her diet.  Check fasting lipid profile in October Abnormal food appetite She has increased orexigenic signaling, impaired satiety and inhibitory control. 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  In addition to nutritional and behavioral strategies she benefits from pharmacotherapy.  She will be started on Wegovy  0.25 mg once a week     Objective   Physical Exam:  Blood pressure 120/81, pulse 62, temperature 98.6 F (37 C), height 5' 3 (1.6 m), weight 198 lb (89.8 kg), last menstrual period 06/09/2024, SpO2 100%, unknown if currently breastfeeding. Body mass index is  35.07 kg/m.  General: She is overweight, cooperative, alert, well developed, and in no acute distress. PSYCH: Has normal mood, affect and thought process.   HEENT: EOMI, sclerae are anicteric. Lungs: Normal breathing effort, no conversational dyspnea. Extremities: No edema.  Neurologic: No gross sensory or motor deficits. No tremors or fasciculations noted.     Diagnostic Data Reviewed:  BMET    Component Value Date/Time   NA 140 04/07/2024 0904   K 4.3 04/07/2024 0904   CL 102 04/07/2024 0904   CO2 21 04/07/2024 0904   GLUCOSE 87 04/07/2024 0904   GLUCOSE 143 (H) 02/24/2024 0350   BUN 10 04/07/2024 0904   CREATININE 0.53 (L) 04/07/2024 0904   CALCIUM 9.3 04/07/2024 0904   GFRNONAA >60 02/24/2024 0350   Lab Results  Component Value Date   HGBA1C 5.2 04/07/2024   HGBA1C 5.5 01/03/2023   Lab Results  Component Value Date   INSULIN  12.7 04/07/2024   Lab Results  Component Value Date   TSH 9.69 (H) 12/22/2023   CBC    Component Value Date/Time   WBC 11.9 (H) 02/26/2024 0646   RBC 3.47 (L) 02/26/2024 0646   HGB 8.9 (L) 02/26/2024 0646   HGB 12.7 03/20/2023 1435   HCT 27.4 (L) 02/26/2024 0646   HCT 37.8 03/20/2023 1435   PLT 322 02/26/2024 0646   PLT 201 03/20/2023 1435   MCV 79.0 (L) 02/26/2024 0646   MCV 89 03/20/2023 1435   MCH 25.6 (L) 02/26/2024 0646   MCHC 32.5 02/26/2024 0646   RDW 18.2 (H) 02/26/2024 0646   RDW 16.5 (H) 03/20/2023 1435   Iron  Studies No results found for: IRON , TIBC, FERRITIN, IRONPCTSAT Lipid Panel     Component Value Date/Time   CHOL 153 04/07/2024 0904   TRIG 91 04/07/2024 0904   HDL 37 (L) 04/07/2024 0904   LDLCALC 99 04/07/2024 0904   Hepatic Function Panel     Component Value Date/Time   PROT 7.4 04/07/2024 0904   ALBUMIN  4.4 04/07/2024 0904   AST 18 04/07/2024 0904   ALT 15 04/07/2024 0904   ALKPHOS 113 04/07/2024 0904   BILITOT 0.3 04/07/2024 0904      Component Value Date/Time   TSH 9.69 (H) 12/22/2023 0802   Nutritional Lab Results  Component Value Date   VD25OH 11.2 (L) 04/07/2024    Medications: Outpatient Encounter Medications as of 06/22/2024  Medication Sig   calcium carbonate (TUMS - DOSED IN MG ELEMENTAL CALCIUM) 500 MG chewable tablet Chew 1 tablet by mouth as needed for indigestion or heartburn.   ferrous sulfate  325 (65 FE) MG tablet Take 1  tablet (325 mg total) by mouth every other day.   fexofenadine (ALLEGRA) 180 MG tablet Take 180 mg by mouth as needed for allergies or rhinitis.   levothyroxine  (SYNTHROID ) 125 MCG tablet Take 1 tablet (125 mcg total) by mouth daily.   semaglutide -weight management (WEGOVY ) 0.25 MG/0.5ML SOAJ SQ injection Inject 0.25 mg into the skin once a week.   Vitamin D , Ergocalciferol , (DRISDOL ) 1.25 MG (50000 UNIT) CAPS capsule Take 1 capsule (50,000 Units total) by mouth every 7 (seven) days.   No facility-administered encounter medications on file as of 06/22/2024.     Follow-Up   Return in about 4 weeks (around 07/20/2024) for For Weight Mangement with Dr. [email or web address] She was informed of the importance of frequent follow up visits to maximize her success with intensive lifestyle modifications for her multiple health conditions.  Attestation Statement   Reviewed  by clinician on day of visit: allergies, medications, problem list, medical history, surgical history, family history, social history, and previous encounter notes.   I have spent 42 minutes in the care of the patient today including: 2 minutes before the visit reviewing and preparing the chart. 32 minutes face-to-face assessing and reviewing listed medical problems as outlined in obesity care plan, providing nutritional and behavioral counseling on topics outlined in the obesity care plan, counseling regarding anti-obesity medication as outlined in obesity care plan, independently interpreting test results and goals of care, as described in assessment and plan, reviewing and discussing biometric information and progress, and ordering medications - see orders 8 minutes after the visit updating chart and documentation of encounter.    Lucas Parker, MD

## 2024-06-22 NOTE — Assessment & Plan Note (Signed)
 I reviewed labs at [facility] she had an LDL just above 100 with normal triglycerides and HDL cholesterol.  She has family history of metabolic conditions, had hypertension induced by pregnancy so may be at higher risk for cardiovascular disease in the future.  Losing 10% of body weight may improve LDL cholesterol.  Also reducing saturated fats in her diet.  Check fasting lipid profile in October

## 2024-06-22 NOTE — Assessment & Plan Note (Signed)
 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 Optimal level < 1.9.  She also has acanthosis nigricans on the neck.  This is complex condition associated with genetics, ectopic fat and lifestyle factors. Insulin  resistance may result in increased fat storage, inhibition of the breakdown of fat, cause fluctuations in blood sugar leading to energy crashes and increased cravings for sugary or high carb foods and cause metabolic slowdown making it difficult to lose weight.  This may result in additional weight gain and lead to pre-diabetes and diabetes if untreated. In addition, hyperinsulinemia increases cardiovascular risk, chronic inflammatory response and may increase the risk of obesity related malignancies.  Lab Results  Component Value Date   HGBA1C 5.2 04/07/2024   Lab Results  Component Value Date   INSULIN  12.7 04/07/2024   Lab Results  Component Value Date   GLUCOSE 87 04/07/2024   GLUCOSE 78 02/18/2023    We reviewed treatment options which include losing 7 to 10% of body weight, increasing volume of physical activity and maintaining a diet low in saturated fats and with a low glycemic load.  Patient has also been educated on the carb insulin  model of obesity.  Patient is now open to pharmacotherapy.  I feel that she would be a good candidate for GLP-1 aided weight loss.  She will be started on Wegovy  for pharmacoprophylaxis.

## 2024-06-22 NOTE — Assessment & Plan Note (Signed)
 Send starting program she has lost 6 pounds.  She has had a recent lapse in her nutrition plan due to family and travel.  She also has problems with hunger control and appears to have a hungry gut phenotype.  She benefits from increasing fiber, protein intake to delay gastric emptying.  GLP-1 would also be of benefit.  After discussion of benefits and side effects he is agreeable to starting Wegovy  0.25 mg once a week

## 2024-07-21 ENCOUNTER — Encounter (INDEPENDENT_AMBULATORY_CARE_PROVIDER_SITE_OTHER): Payer: Self-pay | Admitting: Internal Medicine

## 2024-07-21 ENCOUNTER — Telehealth (INDEPENDENT_AMBULATORY_CARE_PROVIDER_SITE_OTHER): Payer: Self-pay

## 2024-07-21 ENCOUNTER — Other Ambulatory Visit (INDEPENDENT_AMBULATORY_CARE_PROVIDER_SITE_OTHER): Payer: Self-pay | Admitting: Internal Medicine

## 2024-07-21 ENCOUNTER — Ambulatory Visit (INDEPENDENT_AMBULATORY_CARE_PROVIDER_SITE_OTHER): Admitting: Internal Medicine

## 2024-07-21 VITALS — BP 125/80 | HR 70 | Temp 97.4°F | Ht 63.0 in | Wt 198.0 lb

## 2024-07-21 DIAGNOSIS — E063 Autoimmune thyroiditis: Secondary | ICD-10-CM

## 2024-07-21 DIAGNOSIS — Z6835 Body mass index (BMI) 35.0-35.9, adult: Secondary | ICD-10-CM | POA: Diagnosis not present

## 2024-07-21 DIAGNOSIS — E66812 Obesity, class 2: Secondary | ICD-10-CM | POA: Diagnosis not present

## 2024-07-21 DIAGNOSIS — E88819 Insulin resistance, unspecified: Secondary | ICD-10-CM

## 2024-07-21 DIAGNOSIS — E7800 Pure hypercholesterolemia, unspecified: Secondary | ICD-10-CM

## 2024-07-21 MED ORDER — METFORMIN HCL ER 500 MG PO TB24: 500.0000 mg | ORAL_TABLET | Freq: Every day | ORAL | 0 refills | Status: DC

## 2024-07-21 NOTE — Telephone Encounter (Signed)
 PA started

## 2024-07-21 NOTE — Assessment & Plan Note (Signed)
 Hypothyroidism is managed with Synthroid , which she is taking as prescribed.

## 2024-07-21 NOTE — Assessment & Plan Note (Signed)
 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 Optimal level < 1.9.  She also has acanthosis nigricans on the neck.  This is complex condition associated with genetics, ectopic fat and lifestyle factors. Insulin  resistance may result in increased fat storage, inhibition of the breakdown of fat, cause fluctuations in blood sugar leading to energy crashes and increased cravings for sugary or high carb foods and cause metabolic slowdown making it difficult to lose weight.  This may result in additional weight gain and lead to pre-diabetes and diabetes if untreated. In addition, hyperinsulinemia increases cardiovascular risk, chronic inflammatory response and may increase the risk of obesity related malignancies.  Lab Results  Component Value Date   HGBA1C 5.2 04/07/2024   Lab Results  Component Value Date   INSULIN  12.7 04/07/2024   Lab Results  Component Value Date   GLUCOSE 87 04/07/2024   GLUCOSE 78 02/18/2023    We reviewed treatment options which include losing 7 to 10% of body weight, increasing volume of physical activity and maintaining a diet low in saturated fats and with a low glycemic load.  Patient has also been educated on the carb insulin  model of obesity.  She had been started on Wegovy  for pharmacoprophylaxis and weight management but this is pending approval.  We have staff looking into this.  After discussion of benefits and side effects she will be started on metformin  XR 1 tablet daily for pharmacoprophylaxis and weight management

## 2024-07-21 NOTE — Progress Notes (Signed)
 Office: [phone]  /  Fax: [phone]  Weight Summary and Body Composition Analysis (BIA)  Vitals Temp: (!) 97.4 F (36.3 C) BP: 125/80 Pulse Rate: 70 SpO2: 99 %   Anthropometric Measurements Height: 5' 3 (1.6 m) Weight: 198 lb (89.8 kg) BMI (Calculated): 35.08 Weight at Last Visit: 198 Weight Lost Since Last Visit: 0 lb Weight Gained Since Last Visit: 0 lb Starting Weight: 204 lb Total Weight Loss (lbs): 6 lb (2.722 kg) Peak Weight: 205 lb   Body Composition  Body Fat %: 40.1 % Fat Mass (lbs): 79.6 lbs Muscle Mass (lbs): 112.8 lbs Total Body Water (lbs): 76.2 lbs Visceral Fat Rating : 8    RMR: 1728  Today's Visit #: 5  Starting Date: 04/08/23   Subjective   Chief Complaint: Obesity  Interval History Discussed the use of AI scribe software for clinical note transcription with the patient, who gave verbal consent to proceed.  History of Present Illness Joann Fleming is a 28 year old female who presents for medical weight management.  She has been following a twelve hundred calorie nutrition plan approximately seventy percent of the time, focusing on whole foods and maintaining adequate hydration. She exercises five days a week for thirty to forty-five minutes, primarily walking. Despite these efforts, she has not lost weight over the past three weeks, which she attributes to being on vacation and eating out frequently, although she maintained her weight due to increased physical activity while traveling.  She was previously started on Wegovy  during her last office visit but has not received the medication due to insurance issues. She experiences emotional eating, particularly when bored or stressed. She attempts to manage this by drinking water and engaging in distracting activities.  She is currently taking Synthroid  for thyroid  management and vitamin D  supplements. She has a history of anemia for which she has received iron  infusions and has a  follow-up appointment scheduled for September 28th. She is also dealing with recurrent MRSA skin infections, for which she has been prescribed clindamycin and Augmentin, along with chlorhexidine  washes and antibiotic wipes.  She reports experiencing loose stools, which she attributes to her current antibiotic regimen. She has been on multiple courses of antibiotics this year, which may have impacted her gut bacteria and weight management.     Challenges affecting patient progress: recent lapse in weight management plan due to work, travel, illness or family circumstances.    Pharmacotherapy for weight management: She is currently taking no anti-obesity medication and she had been prescribed Wegovy  but has not been dispensed staff looking into the matter.   Assessment and Plan   Treatment Plan For Obesity:  Recommended Dietary Goals  Gabriella is currently in the action stage of change. As such, her goal is to continue weight management plan. She has agreed to: continue current plan  Behavioral Health and Counseling  We discussed the following behavioral modification strategies today: continue to work on maintaining a reduced calorie state, getting the recommended amount of protein, incorporating whole foods, making healthy choices, staying well hydrated and practicing mindfulness when eating. and increase protein intake, fibrous foods (25 grams per day for women, 30 grams for men) and water to improve satiety and decrease hunger signals. .  Additional education and resources provided today: None  Recommended Physical Activity Goals  Meelah has been advised to work up to 150 minutes of moderate intensity aerobic activity a week and strengthening exercises 2-3 times per week for cardiovascular health, weight loss maintenance  and preservation of muscle mass.  She has agreed to :  Think about enjoyable ways to increase daily physical activity and overcoming barriers to exercise, Increase  physical activity in their day and reduce sedentary time (increase NEAT)., Increase volume of physical activity to a goal of 240 minutes a week, and Combine aerobic and strengthening exercises for efficiency and improved cardiometabolic health.  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 She adheres to a 1200 calorie nutrition plan 70% of the time and exercises five days a week. Delays in obtaining Wegovy  persist. Emotional eating when bored or stressed remains a challenge. Metformin  is being initiated to assist with insulin  resistance and appetite control while awaiting Wegovy  authorization. - Start metformin  500 mg daily to assist with insulin  resistance and appetite control while awaiting Wegovy  authorization. - Advise on strategies to manage emotional eating, such as engaging in stimulating activities to distract from food cravings. - Work on getting back on track following vacationing. 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 Optimal level < 1.9.  She also has acanthosis nigricans on the neck.  This is complex condition associated with genetics, ectopic fat and lifestyle factors. Insulin  resistance may result in increased fat storage, inhibition of the breakdown of fat, cause fluctuations in blood sugar leading to energy crashes and increased cravings for sugary or high carb foods and cause metabolic slowdown  making it difficult to lose weight.  This may result in additional weight gain and lead to pre-diabetes and diabetes if untreated. In addition, hyperinsulinemia increases cardiovascular risk, chronic inflammatory response and may increase the risk of obesity related malignancies.  Lab Results  Component Value Date   HGBA1C 5.2 04/07/2024   Lab Results  Component Value Date   INSULIN  12.7 04/07/2024   Lab Results  Component Value Date   GLUCOSE 87 04/07/2024   GLUCOSE 78 02/18/2023    We reviewed treatment options which include losing 7 to 10% of body weight, increasing volume of physical activity and maintaining a diet low in saturated fats and with a low glycemic load.  Patient has also been educated on the carb insulin  model of obesity.  She had been started on Wegovy  for pharmacoprophylaxis and weight management but this is pending approval.  We have staff looking into this.  After discussion of benefits and side effects she will be started on metformin  XR 1 tablet daily for pharmacoprophylaxis and weight management  Hypothyroidism due to Hashimoto thyroiditis Hypothyroidism is managed with Synthroid , which she is taking as prescribed.          Objective   Physical Exam:  Blood pressure 125/80, pulse 70, temperature (!) 97.4 F (36.3 C), height 5' 3 (1.6 m), weight 198 lb (89.8 kg), last menstrual period 07/04/2024, SpO2 99%, unknown if currently breastfeeding. Body mass index is 35.07 kg/m.  General: She is overweight, cooperative, alert, well developed, and in no acute distress. PSYCH: Has normal mood, affect and thought process.  HEENT: EOMI, sclerae are anicteric. Lungs: Normal breathing effort, no conversational dyspnea. Extremities: No edema.  Neurologic: No gross sensory or motor deficits. No tremors or fasciculations noted.    Diagnostic Data Reviewed:  BMET    Component Value Date/Time   NA 140 04/07/2024 0904   K 4.3 04/07/2024 0904   CL 102  04/07/2024 0904   CO2 21 04/07/2024 0904   GLUCOSE 87 04/07/2024 0904   GLUCOSE 143 (H) 02/24/2024 0350   BUN 10 04/07/2024 0904   CREATININE 0.53 (L) 04/07/2024 0904   CALCIUM 9.3 04/07/2024 0904   GFRNONAA >60 02/24/2024 0350   Lab Results  Component Value Date   HGBA1C 5.2 04/07/2024   HGBA1C 5.5 01/03/2023   Lab Results  Component Value Date   INSULIN  12.7 04/07/2024   Lab Results  Component Value Date   TSH 9.69 (H) 12/22/2023   CBC    Component Value Date/Time   WBC 11.9 (H) 02/26/2024 0646   RBC 3.47 (L) 02/26/2024 0646   HGB 8.9 (L) 02/26/2024 0646   HGB 12.7 03/20/2023 1435   HCT 27.4 (L) 02/26/2024 0646   HCT 37.8 03/20/2023 1435   PLT 322 02/26/2024 0646   PLT 201 03/20/2023 1435   MCV 79.0 (L) 02/26/2024 0646   MCV 89 03/20/2023 1435   MCH 25.6 (L) 02/26/2024 0646   MCHC 32.5 02/26/2024 0646   RDW 18.2 (H) 02/26/2024 0646   RDW 16.5 (H) 03/20/2023 1435   Iron  Studies No results found for: IRON , TIBC, FERRITIN, IRONPCTSAT Lipid Panel     Component Value Date/Time   CHOL 153 04/07/2024 0904   TRIG 91 04/07/2024 0904   HDL 37 (L) 04/07/2024 0904   LDLCALC 99 04/07/2024 0904   Hepatic Function Panel     Component Value Date/Time   PROT 7.4 04/07/2024 0904   ALBUMIN  4.4 04/07/2024 0904   AST 18 04/07/2024 0904   ALT 15 04/07/2024 0904   ALKPHOS 113 04/07/2024 0904   BILITOT 0.3 04/07/2024 0904      Component Value Date/Time   TSH 9.69 (H) 12/22/2023 0802   Nutritional Lab Results  Component Value Date   VD25OH 11.2 (L) 04/07/2024    Medications: Outpatient Encounter Medications as of 07/21/2024  Medication Sig   calcium carbonate (TUMS - DOSED IN MG ELEMENTAL CALCIUM) 500 MG chewable tablet Chew 1 tablet by mouth as needed for indigestion or heartburn.   ferrous sulfate  325 (65 FE) MG tablet Take 1 tablet (325 mg total) by mouth every other day.   fexofenadine (ALLEGRA) 180 MG tablet Take 180 mg by mouth as needed for allergies  or rhinitis.   levothyroxine  (SYNTHROID ) 125 MCG tablet Take 1 tablet (125 mcg total) by mouth daily.   metFORMIN  (GLUCOPHAGE -XR) 500 MG 24 hr tablet Take 1 tablet (500 mg total) by mouth daily with breakfast.   semaglutide -weight management (WEGOVY ) 0.25 MG/0.5ML SOAJ SQ injection Inject 0.25 mg into the skin once a week.   Vitamin D , Ergocalciferol , (DRISDOL ) 1.25 MG (50000 UNIT) CAPS capsule Take 1 capsule (50,000 Units total) by mouth every 7 (seven) days.   No facility-administered encounter medications on file as of 07/21/2024.     Follow-Up   Return in about 4 weeks (around 08/18/2024) for For Weight Mangement with Dr. Francyne.SABRA She was informed of the importance of frequent follow up visits to maximize her success with intensive lifestyle modifications for her multiple health conditions.  Attestation Statement   Reviewed by clinician on day of visit:  allergies, medications, problem list, medical history, surgical history, family history, social history, and previous encounter notes.     Lucas Parker, MD

## 2024-07-21 NOTE — Progress Notes (Deleted)
 Office: [phone]  /  Fax: [phone]  Weight Summary and Body Composition Analysis (BIA)  Vitals Temp: (!) 97.4 F (36.3 C) BP: 125/80 Pulse Rate: 70 SpO2: 99 %   Anthropometric Measurements Height: 5' 3 (1.6 m) Weight: 198 lb (89.8 kg) BMI (Calculated): 35.08 Weight at Last Visit: 198 Weight Lost Since Last Visit: 0 lb Weight Gained Since Last Visit: 0 lb Starting Weight: 204 lb Total Weight Loss (lbs): 6 lb (2.722 kg) Peak Weight: 205 lb   Body Composition  Body Fat %: 40.1 % Fat Mass (lbs): 79.6 lbs Muscle Mass (lbs): 112.8 lbs Total Body Water (lbs): 76.2 lbs Visceral Fat Rating : 8    RMR: 1728  Today's Visit #: 5  Starting Date: 04/08/23   Subjective   Chief Complaint: Obesity  Interval History Discussed the use of AI scribe software for clinical note transcription with the patient, who gave verbal consent to proceed.  History of Present Illness      Challenges affecting patient progress: {EMOBESITYBARRIERS:28841::none}.    Pharmacotherapy for weight management: She is currently taking {EMPharmaco:28845}.   Assessment and Plan   Treatment Plan For Obesity:  Recommended Dietary Goals  Bambi is currently in the action stage of change. As such, her goal is to continue weight management plan. She has agreed to: {EMWTLOSSPLAN:29297::continue current plan}  Behavioral Health and Counseling  We discussed the following behavioral modification strategies today: {EMWMwtlossstrategies:28914::continue to work on maintaining a reduced calorie state, getting the recommended amount of protein, incorporating whole foods, making healthy choices, staying well hydrated and practicing mindfulness when eating.,increase protein intake, fibrous foods (25 grams per day for women, 30 grams for men) and water to improve satiety and decrease hunger signals. }.  Additional education and resources provided  today: {EMadditionalresources:29169::None}  Recommended Physical Activity Goals  Rickesha has been advised to work up to 150 minutes of moderate intensity aerobic activity a week and strengthening exercises 2-3 times per week for cardiovascular health, weight loss maintenance and preservation of muscle mass.  She has agreed to :  {EMEXERCISE:28847::Think about enjoyable ways to increase daily physical activity and overcoming barriers to exercise,Increase physical activity in their day and reduce sedentary time (increase NEAT).,Increase volume of physical activity to a goal of 240 minutes a week,Combine aerobic and strengthening exercises for efficiency and improved cardiometabolic health.}  Medical Interventions and Pharmacotherapy  We discussed various medication options to help Aniesha with her weight loss efforts and we both agreed to : {EMagreedrx:29170}  Associated Conditions Impacted by Obesity Treatment  Assessment & Plan Class 2 obesity without serious comorbidity with body mass index (BMI) of 35.0 to 35.9 in adult, unspecified obesity type  Insulin  resistance     Assessment and Plan Assessment & Plan       Objective   Physical Exam:  Blood pressure 125/80, pulse 70, temperature (!) 97.4 F (36.3 C), height 5' 3 (1.6 m), weight 198 lb (89.8 kg), last menstrual period 07/04/2024, SpO2 99%, unknown if currently breastfeeding. Body mass index is 35.07 kg/m.  General: She is overweight, cooperative, alert, well developed, and in no acute distress. PSYCH: Has normal mood, affect and thought process.   HEENT: EOMI, sclerae are anicteric. Lungs: Normal breathing effort, no conversational dyspnea. Extremities: No edema.  Neurologic: No gross sensory or motor deficits. No tremors or fasciculations noted.    Diagnostic Data Reviewed:  BMET    Component Value Date/Time   NA 140 04/07/2024 0904   K 4.3 04/07/2024 0904   CL  102 04/07/2024 0904   CO2 21  04/07/2024 0904   GLUCOSE 87 04/07/2024 0904   GLUCOSE 143 (H) 02/24/2024 0350   BUN 10 04/07/2024 0904   CREATININE 0.53 (L) 04/07/2024 0904   CALCIUM 9.3 04/07/2024 0904   GFRNONAA >60 02/24/2024 0350   Lab Results  Component Value Date   HGBA1C 5.2 04/07/2024   HGBA1C 5.5 01/03/2023   Lab Results  Component Value Date   INSULIN  12.7 04/07/2024   Lab Results  Component Value Date   TSH 9.69 (H) 12/22/2023   CBC    Component Value Date/Time   WBC 11.9 (H) 02/26/2024 0646   RBC 3.47 (L) 02/26/2024 0646   HGB 8.9 (L) 02/26/2024 0646   HGB 12.7 03/20/2023 1435   HCT 27.4 (L) 02/26/2024 0646   HCT 37.8 03/20/2023 1435   PLT 322 02/26/2024 0646   PLT 201 03/20/2023 1435   MCV 79.0 (L) 02/26/2024 0646   MCV 89 03/20/2023 1435   MCH 25.6 (L) 02/26/2024 0646   MCHC 32.5 02/26/2024 0646   RDW 18.2 (H) 02/26/2024 0646   RDW 16.5 (H) 03/20/2023 1435   Iron  Studies No results found for: IRON , TIBC, FERRITIN, IRONPCTSAT Lipid Panel     Component Value Date/Time   CHOL 153 04/07/2024 0904   TRIG 91 04/07/2024 0904   HDL 37 (L) 04/07/2024 0904   LDLCALC 99 04/07/2024 0904   Hepatic Function Panel     Component Value Date/Time   PROT 7.4 04/07/2024 0904   ALBUMIN  4.4 04/07/2024 0904   AST 18 04/07/2024 0904   ALT 15 04/07/2024 0904   ALKPHOS 113 04/07/2024 0904   BILITOT 0.3 04/07/2024 0904      Component Value Date/Time   TSH 9.69 (H) 12/22/2023 0802   Nutritional Lab Results  Component Value Date   VD25OH 11.2 (L) 04/07/2024    Medications: Outpatient Encounter Medications as of 07/21/2024  Medication Sig   calcium carbonate (TUMS - DOSED IN MG ELEMENTAL CALCIUM) 500 MG chewable tablet Chew 1 tablet by mouth as needed for indigestion or heartburn.   ferrous sulfate  325 (65 FE) MG tablet Take 1 tablet (325 mg total) by mouth every other day.   fexofenadine (ALLEGRA) 180 MG tablet Take 180 mg by mouth as needed for allergies or rhinitis.    levothyroxine  (SYNTHROID ) 125 MCG tablet Take 1 tablet (125 mcg total) by mouth daily.   semaglutide -weight management (WEGOVY ) 0.25 MG/0.5ML SOAJ SQ injection Inject 0.25 mg into the skin once a week.   Vitamin D , Ergocalciferol , (DRISDOL ) 1.25 MG (50000 UNIT) CAPS capsule Take 1 capsule (50,000 Units total) by mouth every 7 (seven) days.   No facility-administered encounter medications on file as of 07/21/2024.     Follow-Up   No follow-ups on file.SABRA She was informed of the importance of frequent follow up visits to maximize her success with intensive lifestyle modifications for her multiple health conditions.  Attestation Statement   Reviewed by clinician on day of visit: allergies, medications, problem list, medical history, surgical history, family history, social history, and previous encounter notes.     Lucas Parker, MD

## 2024-07-21 NOTE — Assessment & Plan Note (Signed)
 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 She adheres to a 1200 calorie nutrition plan 70% of the time and exercises five days a week. Delays in obtaining Wegovy  persist. Emotional eating when bored or stressed remains a challenge. Metformin  is being initiated to assist with insulin  resistance and appetite control while awaiting Wegovy  authorization. - Start metformin  500 mg daily to assist with insulin  resistance and appetite control while awaiting Wegovy  authorization. - Advise on strategies to manage emotional eating, such as engaging in stimulating activities to distract from food cravings. - Work on getting back on track following vacationing.

## 2024-07-26 ENCOUNTER — Encounter (INDEPENDENT_AMBULATORY_CARE_PROVIDER_SITE_OTHER): Payer: Self-pay

## 2024-07-26 NOTE — Telephone Encounter (Signed)
 PA approved, pt notified

## 2024-08-15 ENCOUNTER — Other Ambulatory Visit (INDEPENDENT_AMBULATORY_CARE_PROVIDER_SITE_OTHER): Payer: Self-pay | Admitting: Internal Medicine

## 2024-08-15 DIAGNOSIS — E88819 Insulin resistance, unspecified: Secondary | ICD-10-CM

## 2024-08-15 DIAGNOSIS — Z6835 Body mass index (BMI) 35.0-35.9, adult: Secondary | ICD-10-CM

## 2024-08-18 ENCOUNTER — Encounter (INDEPENDENT_AMBULATORY_CARE_PROVIDER_SITE_OTHER): Payer: Self-pay | Admitting: Internal Medicine

## 2024-08-18 ENCOUNTER — Ambulatory Visit (INDEPENDENT_AMBULATORY_CARE_PROVIDER_SITE_OTHER): Admitting: Internal Medicine

## 2024-08-18 VITALS — BP 115/79 | HR 77 | Temp 98.3°F | Ht 63.0 in | Wt 200.0 lb

## 2024-08-18 DIAGNOSIS — R638 Other symptoms and signs concerning food and fluid intake: Secondary | ICD-10-CM | POA: Diagnosis not present

## 2024-08-18 DIAGNOSIS — E66812 Obesity, class 2: Secondary | ICD-10-CM

## 2024-08-18 DIAGNOSIS — Z6835 Body mass index (BMI) 35.0-35.9, adult: Secondary | ICD-10-CM | POA: Diagnosis not present

## 2024-08-18 DIAGNOSIS — E88819 Insulin resistance, unspecified: Secondary | ICD-10-CM

## 2024-08-18 NOTE — Assessment & Plan Note (Signed)
 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 Optimal level < 1.9.  She also has acanthosis nigricans on the neck.  This is complex condition associated with genetics, ectopic fat and lifestyle factors. Insulin  resistance may result in increased fat storage, inhibition of the breakdown of fat, cause fluctuations in blood sugar leading to energy crashes and increased cravings for sugary or high carb foods and cause metabolic slowdown making it difficult to lose weight.  This may result in additional weight gain and lead to pre-diabetes and diabetes if untreated. In addition, hyperinsulinemia increases cardiovascular risk, chronic inflammatory response and may increase the risk of obesity related malignancies.  Lab Results  Component Value Date   HGBA1C 5.2 04/07/2024   Lab Results  Component Value Date   INSULIN  12.7 04/07/2024   Lab Results  Component Value Date   GLUCOSE 87 04/07/2024   GLUCOSE 78 02/18/2023    Restart metformin  for pharmacoprophylaxis

## 2024-08-18 NOTE — Progress Notes (Signed)
 Office: [phone]  /  Fax: [phone]  Weight Summary and Body Composition Analysis (BIA)  Vitals Temp: 98.3 F (36.8 C) BP: 115/79 Pulse Rate: 77 SpO2: 97 %   Anthropometric Measurements Height: 5' 3 (1.6 m) Weight: 200 lb (90.7 kg) BMI (Calculated): 35.44 Weight at Last Visit: 198 lb Weight Lost Since Last Visit: 0 lb Weight Gained Since Last Visit: 2 lb Starting Weight: 204 lb Total Weight Loss (lbs): 4 lb (1.814 kg) Peak Weight: 205 lb   Body Composition  Body Fat %: 39.7 % Fat Mass (lbs): 79.4 lbs Muscle Mass (lbs): 114.6 lbs Total Body Water (lbs): 77.2 lbs Visceral Fat Rating : 8    RMR: 1728  Today's Visit #: 6  Starting Date: 04/08/23   Subjective   Chief Complaint: Obesity  Interval History Discussed the use of AI scribe software for clinical note transcription with the patient, who gave verbal consent to proceed.  History of Present Illness Joann Fleming is a 28 year old female with insulin  resistance and hypercholesterolemia who presents for medical weight management.  She has gained two pounds since her last visit despite adhering to a twelve hundred calorie nutrition plan approximately eighty percent of the time. She tracks her food intake, focuses on whole foods, and ensures adequate protein consumption. No skipped meals are reported, and she exercises four to five days a week for thirty minutes each session.  Bioimpedance information suggests gains in muscle mass and reductions in body fat.  She started a weight loss medication but experienced significant side effects during the first week, including feeling very sick. By the third week, she developed intense cravings, particularly for sweets, and acknowledges an increase in carbohydrate intake, including sweet potatoes, potatoes, oats, and bread.  He no longer has coverage for Wegovy  due to changes to Medicaid.  Her clothes are fitting looser. Body composition measurements show a  decrease in body fat percentage from forty-three percent to thirty-nine percent and an increase in muscle mass from one hundred and ten to one hundred and fourteen.  Her insulin  levels were previously found to be slightly elevated.     Challenges affecting patient progress: strong hunger signals and/or impaired satiety / inhibitory control and having difficulties with GLP-1 or AOM coverage.    Pharmacotherapy for weight management: She is currently taking Wegovy  with adequate clinical response  and experiencing the following side effects: nausea and we will no longer have coverage through Medicaid.   Assessment and Plan   Treatment Plan For Obesity:  Recommended Dietary Goals  Joann Fleming is currently in the action stage of change. As such, her goal is to continue weight management plan. She has agreed to: continue current plan  Behavioral Health and Counseling  We discussed the following behavioral modification strategies today: continue to work on maintaining a reduced calorie state, getting the recommended amount of protein, incorporating whole foods, making healthy choices, staying well hydrated and practicing mindfulness when eating. and increase protein intake, fibrous foods (25 grams per day for women, 30 grams for men) and water to improve satiety and decrease hunger signals. .  Additional education and resources provided today: None  Recommended Physical Activity Goals  Joann Fleming has been advised to work up to 150 minutes of moderate intensity aerobic activity a week and strengthening exercises 2-3 times per week for cardiovascular health, weight loss maintenance and preservation of muscle mass.  She has agreed to :  Think about enjoyable ways to increase daily physical activity and overcoming  barriers to exercise, Increase physical activity in their day and reduce sedentary time (increase NEAT)., Increase volume of physical activity to a goal of 240 minutes a week, and Combine aerobic  and strengthening exercises for efficiency and improved cardiometabolic health.  Medical Interventions and Pharmacotherapy  We discussed various medication options to help Joann Fleming with her weight loss efforts and we both agreed to : We are discontinuing Wegovy  due to lack of coverage.  She will be resuming treatment with metformin  for management of insulin  resistance.  We will discuss other antiobesity medications depending on appetite control at the next visit  Associated Conditions Impacted by Obesity Treatment  Assessment & Plan Insulin  resistance Her HOMA-IR is 2.7 to which is elevated. Optimal level < 1.9.  She also has acanthosis nigricans on the neck.  This is complex condition associated with genetics, ectopic fat and lifestyle factors. Insulin  resistance may result in increased fat storage, inhibition of the breakdown of fat, cause fluctuations in blood sugar leading to energy crashes and increased cravings for sugary or high carb foods and cause metabolic slowdown making it difficult to lose weight.  This may result in additional weight gain and lead to pre-diabetes and diabetes if untreated. In addition, hyperinsulinemia increases cardiovascular risk, chronic inflammatory response and may increase the risk of obesity related malignancies.  Lab Results  Component Value Date   HGBA1C 5.2 04/07/2024   Lab Results  Component Value Date   INSULIN  12.7 04/07/2024   Lab Results  Component Value Date   GLUCOSE 87 04/07/2024   GLUCOSE 78 02/18/2023    Restart metformin  for pharmacoprophylaxis  Abnormal food appetite She has increased orexigenic signaling, impaired satiety and inhibitory control. 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  In addition to nutritional and behavioral strategies she benefits from pharmacotherapy.  Recent treatment with metformin   Class 2 obesity without serious  comorbidity with body mass index (BMI) of 35.0 to 35.9 in adult, unspecified obesity type Even though there has not been a significant drop in pounds bioimpedance information suggest improvements in body composition indicating forward progress.  She had taken Wegovy  for less than a month has experienced side effects and medication is no longer covered by Medicaid.  She does have abnormal food appetite and does benefit from an appetite suppressant.  Because of the presence of insulin  resistance I would like for her to restart metformin .  We will reassess satiety and satiation at the next office visit.    Assessment and Plan Assessment & Plan Obesity with insulin  resistance and abnormal food appetite Weight gain of two pounds since last visit despite adherence to a 1200 calorie nutrition plan and regular exercise. Reports increased cravings for sweets and high carbohydrate foods. Body composition analysis shows a decrease in body fat percentage from 43% to 39% and an increase in muscle mass from 109 to 114. Insulin  levels slightly elevated, indicating insulin  resistance. Wegovy  is no longer covered by insurance. - Discontinue Wegovy  due to lack of insurance coverage and adverse effects. - Restart metformin , take daily with breakfast to help with sugar cravings and insulin  regulation. - Review and adjust dietary plan to limit carbohydrate intake to 30-40 grams per serving and less than 120 grams per day. - Encourage continued exercise with increased intensity and shorter rest periods to maintain elevated heart rate. - Reassess in four weeks to evaluate progress and consider alternative medications if needed.  Hypercholesterolemia No specific discussion or changes  in management for hypercholesterolemia during this visit.  Recording duration: 15 minutes      Objective   Physical Exam:  Blood pressure 115/79, pulse 77, temperature 98.3 F (36.8 C), height 5' 3 (1.6 m), weight 200 lb (90.7 kg),  last menstrual period 08/04/2024, SpO2 97%, unknown if currently breastfeeding. Body mass index is 35.43 kg/m.  General: She is overweight, cooperative, alert, well developed, and in no acute distress. PSYCH: Has normal mood, affect and thought process.   HEENT: EOMI, sclerae are anicteric. Lungs: Normal breathing effort, no conversational dyspnea. Extremities: No edema.  Neurologic: No gross sensory or motor deficits. No tremors or fasciculations noted.    Diagnostic Data Reviewed:  BMET    Component Value Date/Time   NA 140 04/07/2024 0904   K 4.3 04/07/2024 0904   CL 102 04/07/2024 0904   CO2 21 04/07/2024 0904   GLUCOSE 87 04/07/2024 0904   GLUCOSE 143 (H) 02/24/2024 0350   BUN 10 04/07/2024 0904   CREATININE 0.53 (L) 04/07/2024 0904   CALCIUM 9.3 04/07/2024 0904   GFRNONAA >60 02/24/2024 0350   Lab Results  Component Value Date   HGBA1C 5.2 04/07/2024   HGBA1C 5.5 01/03/2023   Lab Results  Component Value Date   INSULIN  12.7 04/07/2024   Lab Results  Component Value Date   TSH 9.69 (H) 12/22/2023   CBC    Component Value Date/Time   WBC 11.9 (H) 02/26/2024 0646   RBC 3.47 (L) 02/26/2024 0646   HGB 8.9 (L) 02/26/2024 0646   HGB 12.7 03/20/2023 1435   HCT 27.4 (L) 02/26/2024 0646   HCT 37.8 03/20/2023 1435   PLT 322 02/26/2024 0646   PLT 201 03/20/2023 1435   MCV 79.0 (L) 02/26/2024 0646   MCV 89 03/20/2023 1435   MCH 25.6 (L) 02/26/2024 0646   MCHC 32.5 02/26/2024 0646   RDW 18.2 (H) 02/26/2024 0646   RDW 16.5 (H) 03/20/2023 1435   Iron  Studies No results found for: IRON , TIBC, FERRITIN, IRONPCTSAT Lipid Panel     Component Value Date/Time   CHOL 153 04/07/2024 0904   TRIG 91 04/07/2024 0904   HDL 37 (L) 04/07/2024 0904   LDLCALC 99 04/07/2024 0904   Hepatic Function Panel     Component Value Date/Time   PROT 7.4 04/07/2024 0904   ALBUMIN  4.4 04/07/2024 0904   AST 18 04/07/2024 0904   ALT 15 04/07/2024 0904   ALKPHOS 113  04/07/2024 0904   BILITOT 0.3 04/07/2024 0904      Component Value Date/Time   TSH 9.69 (H) 12/22/2023 0802   Nutritional Lab Results  Component Value Date   VD25OH 11.2 (L) 04/07/2024    Medications: Outpatient Encounter Medications as of 08/18/2024  Medication Sig   calcium carbonate (TUMS - DOSED IN MG ELEMENTAL CALCIUM) 500 MG chewable tablet Chew 1 tablet by mouth as needed for indigestion or heartburn.   ferrous sulfate  325 (65 FE) MG tablet Take 1 tablet (325 mg total) by mouth every other day.   fexofenadine (ALLEGRA) 180 MG tablet Take 180 mg by mouth as needed for allergies or rhinitis.   levothyroxine  (SYNTHROID ) 125 MCG tablet Take 1 tablet (125 mcg total) by mouth daily.   metFORMIN  (GLUCOPHAGE -XR) 500 MG 24 hr tablet Take 1 tablet (500 mg total) by mouth daily with breakfast.   Vitamin D , Ergocalciferol , (DRISDOL ) 1.25 MG (50000 UNIT) CAPS capsule Take 1 capsule (50,000 Units total) by mouth every 7 (seven) days.   [DISCONTINUED] semaglutide -weight  management (WEGOVY ) 0.25 MG/0.5ML SOAJ SQ injection INJECT 0.25MG  INTO THE SKIN ONE TIME PER WEEK   No facility-administered encounter medications on file as of 08/18/2024.     Follow-Up   Return in about 4 weeks (around 09/15/2024) for For Weight Mangement with Dr. Francyne.SABRA She was informed of the importance of frequent follow up visits to maximize her success with intensive lifestyle modifications for her multiple health conditions.  Attestation Statement   Reviewed by clinician on day of visit: allergies, medications, problem list, medical history, surgical history, family history, social history, and previous encounter notes.     Lucas Francyne, MD

## 2024-08-18 NOTE — Assessment & Plan Note (Signed)
 Even though there has not been a significant drop in pounds bioimpedance information suggest improvements in body composition indicating forward progress.  She had taken Wegovy  for less than a month has experienced side effects and medication is no longer covered by Medicaid.  She does have abnormal food appetite and does benefit from an appetite suppressant.  Because of the presence of insulin  resistance I would like for her to restart metformin .  We will reassess satiety and satiation at the next office visit.

## 2024-08-18 NOTE — Assessment & Plan Note (Signed)
 She has increased orexigenic signaling, impaired satiety and inhibitory control. 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  In addition to nutritional and behavioral strategies she benefits from pharmacotherapy.  Recent treatment with metformin 

## 2024-08-25 ENCOUNTER — Ambulatory Visit: Admitting: Internal Medicine

## 2024-08-25 NOTE — Progress Notes (Deleted)
 Name: Joann Fleming  MRN/ DOB: [id number], 05-25-1996    Age/ Sex: 28 y.o., female     PCP:  [facility]   Reason for Endocrinology Evaluation: Hypothyroidism     Initial Endocrinology Clinic Visit: 09/04/2018    PATIENT IDENTIFIER: Joann Fleming is a 28 y.o., female with unremarkable past medical history . She has followed with [facility] since 09/04/2018 for consultative assistance with management of her Hypothyroidism.      HISTORICAL SUMMARY:   In 2018, during work up for severe headaches, she was found to have elevated TSH of 454 uIU/mL . At the time she was euthyroid without any c/o of weight gain, fatigue, constipation or dry skin. Her menses have always been regular. On repeat TSH here in our office (09/04/2018) her TSH was 4.95 uIU/mL  with normal prolactin level.   Her TSH increased to 18.46 uIU/mL in 12/2019 , we attempted to start LT-4 but did not get the message , repeat labs in 12/2020 TSH went down to 3.98 uIU/mL and we opted to hold off on any LT-4 replacement but this started 04/2021 with a TSH 21.22 uIU/mL    S/P NVD 03/2023 - Cedric Blunt    I had referred her to the [facility] in 12/2023  SUBJECTIVE:    Today (08/25/2024):  [name] is here for a follow up on Hashimoto's disease.   Patient follows with [facility] The patient has been to urgent care multiple times for skin infections She has been following up with hematology for iron  deficiency anemia, patient receives iron  infusions   Denies local neck swelling  Denies palpitations  Denies tremors Denies constipation or diarrhea    Levothyroxine  125 mcg daily    HISTORY:  Past Medical History:  Past Medical History:  Diagnosis Date   Abnormal TSH 2018   Anemia    Blood transfusion without reported diagnosis 2019   due to anemia   Eczema    History of group B Streptococcus (GBS) infection     positive with pregnancy at 36 weeks   Hypothyroidism    Pregnancy induced hypertension    UTI (urinary tract infection)    Past Surgical History: Abnormal TSH  Social History:  reports that she has never smoked. She has never used smokeless tobacco. She reports that she does not currently use alcohol. She reports that she does not use drugs. Family History: family history includes Diabetes in her mother; Diabetes type II in her mother; Healthy in her father; Heart Problems in her mother; High Cholesterol in her mother; High blood pressure in her mother; Obesity in her mother.   HOME MEDICATIONS: Allergies as of 08/25/2024   No Known Allergies      Medication List        Accurate as of August 25, 2024  6:53 AM. If you have any questions, ask your nurse or doctor.          calcium carbonate 500 MG chewable tablet Commonly known as: TUMS - dosed in mg elemental calcium Chew 1 tablet by mouth as needed for indigestion or heartburn.   FeroSul 325 (65 Fe) MG tablet Generic drug: ferrous sulfate  Take 1 tablet (325 mg total) by mouth every other day.   fexofenadine 180 MG tablet Commonly known as: ALLEGRA Take 180 mg by mouth as needed for allergies or rhinitis.   levothyroxine  125 MCG tablet Commonly known as: SYNTHROID   Take 1 tablet (125 mcg total) by mouth daily.   metFORMIN  500 MG 24 hr tablet Commonly known as: GLUCOPHAGE -XR Take 1 tablet (500 mg total) by mouth daily with breakfast.   Vitamin D  (Ergocalciferol ) 1.25 MG (50000 UNIT) Caps capsule Commonly known as: DRISDOL  Take 1 capsule (50,000 Units total) by mouth every 7 (seven) days.          OBJECTIVE:   PHYSICAL EXAM: VS: LMP 08/04/2024 (Approximate)    EXAM: General: Pt appears well and is in NAD  Neck: General: Supple without adenopathy. Thyroid : Thyroid  size normal.  No goiter or nodules appreciated.  Lungs: Clear with good BS bilat   Heart: Auscultation: RRR.  Mental Status: Judgment,  insight: Intact Orientation: Oriented to time, place, and person Mood and affect: No depression, anxiety, or agitation     DATA REVIEWED:    Latest Reference Range & Units 12/22/23 08:02  TSH mIU/L 9.69 (H)  T4,Free(Direct) 0.8 - 1.8 ng/dL 0.9  (H): Data is abnormally high   Results for Gallardo, Daje V (MRN [id number]) as of 12/10/2018 12:33  Ref. Range 12/07/2018 08:27  Thyroperoxidase Ab SerPl-aCnc Latest Ref Range: <9 IU/mL 410 (H)    ASSESSMENT / PLAN / RECOMMENDATIONS:   1.  Hashimoto's Thyroiditis :  -Patient with imperfect adherence to levothyroxine  over the past 2 months.  We have opted not to check her TFTs today, I have strongly encouraged the patient to obtain a pillbox again, we discussed symptoms of weight gain, fatigue, constipation with uncontrolled thyroid  disease -No local neck symptoms. - Preconception counseling done today, patient to increase levothyroxine  by 2 tablets 2 days out of the week and remain on 1 tablet the other 5 days of the week and to notify our office Medication   Continue levothyroxine  125 mcg daily   F/U in 4 months     Signed electronically by: Stefano Redgie Butts, MD  [facility]  [facility] [address] [place] [place], [place] [address] Phone: [phone] FAX: [phone]      CC: [facility] Flurry [address] [place], [place] [address] Phone: [phone] Fax: [phone]  Return to Endocrinology clinic as below: Future Appointments  Date Time Provider Department Center  08/25/2024  7:50 AM Lorenz Donley, Donell Redgie, MD [facility] None  09/15/2024 11:20 AM Francyne Romano, MD [facility] None

## 2024-09-15 ENCOUNTER — Ambulatory Visit (INDEPENDENT_AMBULATORY_CARE_PROVIDER_SITE_OTHER): Payer: Self-pay | Admitting: Internal Medicine

## 2024-10-12 ENCOUNTER — Encounter (INDEPENDENT_AMBULATORY_CARE_PROVIDER_SITE_OTHER): Payer: Self-pay | Admitting: Internal Medicine

## 2024-10-12 ENCOUNTER — Ambulatory Visit (INDEPENDENT_AMBULATORY_CARE_PROVIDER_SITE_OTHER): Admitting: Internal Medicine

## 2024-10-12 VITALS — BP 123/86 | HR 69 | Temp 98.2°F | Ht 63.0 in | Wt 198.0 lb

## 2024-10-12 DIAGNOSIS — E88819 Insulin resistance, unspecified: Secondary | ICD-10-CM

## 2024-10-12 DIAGNOSIS — R638 Other symptoms and signs concerning food and fluid intake: Secondary | ICD-10-CM

## 2024-10-12 DIAGNOSIS — E66812 Obesity, class 2: Secondary | ICD-10-CM

## 2024-10-12 MED ORDER — PHENTERMINE HCL 8 MG PO TABS: 8.0000 mg | ORAL_TABLET | Freq: Every day | ORAL | 0 refills | Status: AC

## 2024-10-12 MED ORDER — METFORMIN HCL ER 500 MG PO TB24: 500.0000 mg | ORAL_TABLET | Freq: Every day | ORAL | 0 refills | Status: DC

## 2024-10-12 NOTE — Assessment & Plan Note (Signed)
 Managed with metformin , which also aids in weight loss. She reports occasional nausea as a side effect. Metformin  helps with insulin  resistance and weight management. - Continue metformin  at current dose. - Refilled metformin  prescription.

## 2024-10-12 NOTE — Progress Notes (Signed)
 Office: [phone]  /  Fax: [phone]  Weight Summary and Body Composition Analysis (BIA)  Vitals Temp: 98.2 F (36.8 C) BP: 123/86 Pulse Rate: 69 SpO2: 100 %   Anthropometric Measurements Height: 5' 3 (1.6 m) Weight: 198 lb (89.8 kg) BMI (Calculated): 35.08 Weight at Last Visit: 200 lb Weight Lost Since Last Visit: 2 lb Weight Gained Since Last Visit: 0 lb Starting Weight: 204 lb Total Weight Loss (lbs): 6 lb (2.722 kg) Peak Weight: 205 lb   Body Composition  Body Fat %: 40.6 % Fat Mass (lbs): 80.8 lbs Muscle Mass (lbs): 112 lbs Total Body Water (lbs): 77 lbs Visceral Fat Rating : 8    RMR: 1728  Today's Visit #: 7  Starting Date: 04/07/24   Subjective   Chief Complaint: Obesity  Interval History Discussed the use of AI scribe software for clinical note transcription with the patient, who gave verbal consent to proceed.  History of Present Illness Joann Fleming is a 28 year old female who presents for medical weight management.  She has lost two pounds since her last office visit, adhering to a 1200 calorie nutrition plan approximately 70% of the time and engaging in exercise three to four days a week for 30 minutes. Despite recent travel to [place] for a month, she managed to maintain her weight loss.  She previously took Wegovy  briefly and noticed an increase in hunger after discontinuation. Currently, she feels satisfied after meals but sometimes feels the urge to snack between meals, especially when not occupied. She often thinks about food when idle.  She is currently taking metformin , one tablet daily, but ran out of the medication while traveling. She did not notice a significant change in appetite while on metformin , but experienced nausea initially, which has since improved.  She also takes levothyroxine  in the morning on an empty stomach and has not reported any side effects from this medication.  She had surgery in April to remove one  fallopian tube due to an ectopic pregnancy.     Challenges affecting patient progress: strong hunger signals and/or impaired satiety / inhibitory control.    Pharmacotherapy for weight management: She is currently taking Metformin  (off label use for weight management and / or insulin  resistance and / or diabetes prevention) with adequate clinical response  and without side effects..   Assessment and Plan   Treatment Plan For Obesity:  Recommended Dietary Goals  Joann Fleming is currently in the action stage of change. As such, her goal is to continue weight management plan. She has agreed to: continue current plan  Behavioral Health and Counseling  We discussed the following behavioral modification strategies today: continue to work on maintaining a reduced calorie state, getting the recommended amount of protein, incorporating whole foods, making healthy choices, staying well hydrated and practicing mindfulness when eating. and increase protein intake, fibrous foods (25 grams per day for women, 30 grams for men) and water to improve satiety and decrease hunger signals. .  Additional education and resources provided today: None  Recommended Physical Activity Goals  Joann Fleming has been advised to work up to 150 minutes of moderate intensity aerobic activity a week and strengthening exercises 2-3 times per week for cardiovascular health, weight loss maintenance and preservation of muscle mass.  She has agreed to :  Think about enjoyable ways to increase daily physical activity and overcoming barriers to exercise, Increase physical activity in their day and reduce sedentary time (increase NEAT)., Increase volume of physical activity to  a goal of 240 minutes a week, and Combine aerobic and strengthening exercises for efficiency and improved cardiometabolic health.  Medical Interventions and Pharmacotherapy  We discussed various medication options to help Joann Fleming with her weight loss efforts and we both  agreed to : Start anti-obesity medication.  In addition to reduced calorie nutrition plan (RCNP), behavioral strategies and physical activity, Joann Fleming would benefit from pharmacotherapy to assist with hunger signals, satiety and cravings. This will reduce obesity-related health risks by inducing weight loss, and help reduce food consumption and adherence to Joann Fleming) . It may also improve QOL by improving self-confidence and reduce the  setbacks associated with metabolic adaptations.  After discussion of treatment options, mechanisms of action, benefits, side effects, contraindications and shared decision making she is agreeable to starting Lomaira . Patient also made aware that medication is indicated for long-term management of obesity and the risk of weight regain following discontinuation of treatment and hence the importance of adhering to medical weight loss plan.    We have discussed that long-term use of phentermine  for weight management. Patient informed that use beyond 12 weeks is off-label. Reviewed evidence supporting extended use in select patients with regular monitoring. Risks discussed: insomnia, increased heart rate, elevated BP, and potential for dependence. Alternatives include lifestyle interventions, FDA-approved medications (e.g., Wegovy , Zepbound, Qsymia, Contrave)- these are either not covered, cost prohibitive or contraindicated. Medical history reviewed for contraindications.  Patient verbalized understanding and agreed to continue phentermine  with close monitoring. Plan includes regular follow-up every 4-6 weeks to assess efficacy, side effects, and vitals.  Medication safety: Reviewed common side effects of phentermine , no side effects reported.  Reviewed vitals signs and they are stable Reviewed for contraindications, none present Reviewed state registry for controlled substances and no other controlled substances found Medication will be discontinued if less than 5% weight loss  in 6 months Discussed safety data and off label use for long-term treatment of obesity.   Associated Conditions Impacted by Obesity Treatment  Assessment & Plan Abnormal food appetite She has increased orexigenic signaling, impaired satiety and inhibitory control. 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  In addition to nutritional and behavioral strategies she benefits from pharmacotherapy.  She will be started on phentermine  8 mg 1 tablet daily.  Class 2 obesity without serious comorbidity with body mass index (BMI) of 35.0 to 35.9 in adult, unspecified obesity type She has lost two pounds since the last visit, totaling six pounds, despite recent travel. She adheres to a 1200 calorie nutrition plan 70% of the time and exercises 3-4 days a week for 30 minutes. Phentermine  is considered for appetite suppression and weight loss, with a goal of 5% weight loss within six months. Phentermine  is a controlled medication with potential side effects including increased heart rate and blood pressure, but the risk of dependence is low. It is safe for up to two years of use. Qsymia is not recommended due to potential birth defects without reliable birth control. - Prescribed phentermine  8 mg daily in the morning on an empty stomach. - Continue metformin  at current dose. - Monitor heart rate and blood pressure regularly. - Scheduled follow-up in four weeks. Insulin  resistance Managed with metformin , which also aids in weight loss. She reports occasional nausea as a side effect. Metformin  helps with insulin  resistance and weight management. - Continue metformin  at current dose. - Refilled metformin  prescription.         Objective   Physical  Exam:  Blood pressure 123/86, pulse 69, temperature 98.2 F (36.8 C), height 5' 3 (1.6 m), weight 198 lb (89.8 kg), last menstrual period 10/12/2024, SpO2 100%, unknown  if currently breastfeeding. Body mass index is 35.07 kg/m.  General: She is overweight, cooperative, alert, well developed, and in no acute distress. PSYCH: Has normal mood, affect and thought process.   HEENT: EOMI, sclerae are anicteric. Lungs: Normal breathing effort, no conversational dyspnea. Extremities: No edema.  Neurologic: No gross sensory or motor deficits. No tremors or fasciculations noted.    Diagnostic Data Reviewed:  BMET    Component Value Date/Time   NA 140 04/07/2024 0904   K 4.3 04/07/2024 0904   CL 102 04/07/2024 0904   CO2 21 04/07/2024 0904   GLUCOSE 87 04/07/2024 0904   GLUCOSE 143 (H) 02/24/2024 0350   BUN 10 04/07/2024 0904   CREATININE 0.53 (L) 04/07/2024 0904   CALCIUM 9.3 04/07/2024 0904   GFRNONAA >60 02/24/2024 0350   Lab Results  Component Value Date   HGBA1C 5.2 04/07/2024   HGBA1C 5.5 01/03/2023   Lab Results  Component Value Date   INSULIN  12.7 04/07/2024   Lab Results  Component Value Date   TSH 9.69 (H) 12/22/2023   CBC    Component Value Date/Time   WBC 11.9 (H) 02/26/2024 0646   RBC 3.47 (L) 02/26/2024 0646   HGB 8.9 (L) 02/26/2024 0646   HGB 12.7 03/20/2023 1435   HCT 27.4 (L) 02/26/2024 0646   HCT 37.8 03/20/2023 1435   PLT 322 02/26/2024 0646   PLT 201 03/20/2023 1435   MCV 79.0 (L) 02/26/2024 0646   MCV 89 03/20/2023 1435   MCH 25.6 (L) 02/26/2024 0646   MCHC 32.5 02/26/2024 0646   RDW 18.2 (H) 02/26/2024 0646   RDW 16.5 (H) 03/20/2023 1435   Iron  Studies No results found for: IRON , TIBC, FERRITIN, IRONPCTSAT Lipid Panel     Component Value Date/Time   CHOL 153 04/07/2024 0904   TRIG 91 04/07/2024 0904   HDL 37 (L) 04/07/2024 0904   LDLCALC 99 04/07/2024 0904   Hepatic Function Panel     Component Value Date/Time   PROT 7.4 04/07/2024 0904   ALBUMIN  4.4 04/07/2024 0904   AST 18 04/07/2024 0904   ALT 15 04/07/2024 0904   ALKPHOS 113 04/07/2024 0904   BILITOT 0.3 04/07/2024 0904       Component Value Date/Time   TSH 9.69 (H) 12/22/2023 0802   Nutritional Lab Results  Component Value Date   VD25OH 11.2 (L) 04/07/2024    Medications: Outpatient Encounter Medications as of 10/12/2024  Medication Sig   calcium carbonate (TUMS - DOSED IN MG ELEMENTAL CALCIUM) 500 MG chewable tablet Chew 1 tablet by mouth as needed for indigestion or heartburn.   ferrous sulfate  325 (65 FE) MG tablet Take 1 tablet (325 mg total) by mouth every other day.   fexofenadine (ALLEGRA) 180 MG tablet Take 180 mg by mouth as needed for allergies or rhinitis.   levothyroxine  (SYNTHROID ) 125 MCG tablet Take 1 tablet (125 mcg total) by mouth daily.   metFORMIN  (GLUCOPHAGE -XR) 500 MG 24 hr tablet Take 1 tablet (500 mg total) by mouth daily with breakfast.   Vitamin D , Ergocalciferol , (DRISDOL ) 1.25 MG (50000 UNIT) CAPS capsule Take 1 capsule (50,000 Units total) by mouth every 7 (seven) days.   No facility-administered encounter medications on file as of 10/12/2024.     Follow-Up   No follow-ups on file.Joann Fleming She was informed  of the importance of frequent follow up visits to maximize her success with intensive lifestyle modifications for her multiple health conditions.  Attestation Statement   Reviewed by clinician on day of visit: allergies, medications, problem list, medical history, surgical history, family history, social history, and previous encounter notes.     Lucas Parker, MD

## 2024-10-12 NOTE — Assessment & Plan Note (Signed)
 She has increased orexigenic signaling, impaired satiety and inhibitory control. 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  In addition to nutritional and behavioral strategies she benefits from pharmacotherapy.  She will be started on phentermine  8 mg 1 tablet daily.

## 2024-10-12 NOTE — Assessment & Plan Note (Signed)
 She has lost two pounds since the last visit, totaling six pounds, despite recent travel. She adheres to a 1200 calorie nutrition plan 70% of the time and exercises 3-4 days a week for 30 minutes. Phentermine  is considered for appetite suppression and weight loss, with a goal of 5% weight loss within six months. Phentermine  is a controlled medication with potential side effects including increased heart rate and blood pressure, but the risk of dependence is low. It is safe for up to two years of use. Qsymia is not recommended due to potential birth defects without reliable birth control. - Prescribed phentermine  8 mg daily in the morning on an empty stomach. - Continue metformin  at current dose. - Monitor heart rate and blood pressure regularly. - Scheduled follow-up in four weeks.

## 2024-10-20 ENCOUNTER — Other Ambulatory Visit: Payer: Self-pay

## 2024-10-20 ENCOUNTER — Emergency Department (HOSPITAL_COMMUNITY)

## 2024-10-20 ENCOUNTER — Emergency Department (HOSPITAL_COMMUNITY)
Admission: EM | Admit: 2024-10-20 | Discharge: 2024-10-20 | Disposition: A | Discharge status: Home / Self Care | Attending: Emergency Medicine | Admitting: Emergency Medicine

## 2024-10-20 ENCOUNTER — Encounter (HOSPITAL_COMMUNITY): Payer: Self-pay | Admitting: *Deleted

## 2024-10-20 DIAGNOSIS — E039 Hypothyroidism, unspecified: Secondary | ICD-10-CM | POA: Insufficient documentation

## 2024-10-20 DIAGNOSIS — N390 Urinary tract infection, site not specified: Secondary | ICD-10-CM | POA: Insufficient documentation

## 2024-10-20 DIAGNOSIS — R300 Dysuria: Secondary | ICD-10-CM | POA: Diagnosis present

## 2024-10-20 LAB — URINALYSIS, ROUTINE W REFLEX MICROSCOPIC
Bilirubin Urine: NEGATIVE
Glucose, UA: NEGATIVE mg/dL
Ketones, ur: NEGATIVE mg/dL
Nitrite: NEGATIVE
Protein, ur: 100 mg/dL — AB
Specific Gravity, Urine: 1.024 (ref 1.005–1.030)
WBC, UA: >50 WBC/hpf (ref 0–5)
pH: 5 (ref 5.0–8.0)

## 2024-10-20 LAB — COMPREHENSIVE METABOLIC PANEL WITH GFR
ALT: 19 U/L (ref 0–44)
AST: 26 U/L (ref 15–41)
Albumin: 4.6 g/dL (ref 3.5–5.0)
Alkaline Phosphatase: 102 U/L (ref 38–126)
Anion gap: 10 (ref 5–15)
BUN: 13 mg/dL (ref 6–20)
CO2: 24 mmol/L (ref 22–32)
Calcium: 9.5 mg/dL (ref 8.9–10.3)
Chloride: 103 mmol/L (ref 98–111)
Creatinine, Ser: 0.58 mg/dL (ref 0.44–1.00)
GFR, Estimated: >60 mL/min (ref >60)
Glucose, Bld: 91 mg/dL (ref 70–99)
Potassium: 4 mmol/L (ref 3.5–5.1)
Sodium: 137 mmol/L (ref 135–145)
Total Bilirubin: 0.5 mg/dL (ref 0.0–1.2)
Total Protein: 8.2 g/dL — ABNORMAL HIGH (ref 6.5–8.1)

## 2024-10-20 LAB — CBC WITH DIFFERENTIAL/PLATELET
Abs Immature Granulocytes: 0.06 K/uL (ref 0.00–0.07)
Basophils Absolute: 0.1 K/uL (ref 0.0–0.1)
Basophils Relative: 0 %
Eosinophils Absolute: 0.3 K/uL (ref 0.0–0.5)
Eosinophils Relative: 2 %
HCT: 34.2 % — ABNORMAL LOW (ref 36.0–46.0)
Hemoglobin: 10.5 g/dL — ABNORMAL LOW (ref 12.0–15.0)
Immature Granulocytes: 1 %
Lymphocytes Relative: 17 %
Lymphs Abs: 2 K/uL (ref 0.7–4.0)
MCH: 27.3 pg (ref 26.0–34.0)
MCHC: 30.7 g/dL (ref 30.0–36.0)
MCV: 89.1 fL (ref 80.0–100.0)
Monocytes Absolute: 0.7 K/uL (ref 0.1–1.0)
Monocytes Relative: 6 %
Neutro Abs: 9.1 K/uL — ABNORMAL HIGH (ref 1.7–7.7)
Neutrophils Relative %: 74 %
Platelets: 443 K/uL — ABNORMAL HIGH (ref 150–400)
RBC: 3.84 MIL/uL — ABNORMAL LOW (ref 3.87–5.11)
RDW: 13.2 % (ref 11.5–15.5)
WBC: 12.3 K/uL — ABNORMAL HIGH (ref 4.0–10.5)
nRBC: 0 % (ref 0.0–0.2)

## 2024-10-20 LAB — POC URINE PREG, ED: Preg Test, Ur: NEGATIVE

## 2024-10-20 LAB — LIPASE, BLOOD: Lipase: 38 U/L (ref 11–51)

## 2024-10-20 MED ORDER — SODIUM CHLORIDE 0.9 % IV SOLN
1.0000 g | Freq: Once | INTRAVENOUS | Status: AC
  Administered 2024-10-20: 19:00:00 1 g via INTRAVENOUS
  Filled 2024-10-20: qty 10

## 2024-10-20 MED ORDER — IOHEXOL 350 MG/ML SOLN
100.0000 mL | Freq: Once | INTRAVENOUS | Status: AC | PRN
  Administered 2024-10-20: 18:00:00 100 mL via INTRAVENOUS

## 2024-10-20 NOTE — ED Triage Notes (Signed)
 Patient in ED today with complaints of dysuria and hematuria.  She reports only lower abdominal pain. Denies back pain and fever but does have chills.

## 2024-10-20 NOTE — ED Provider Notes (Cosign Needed)
 [place] EMERGENCY DEPARTMENT AT [facility] Provider Note   CSN: [id number] Arrival date & time: 10/20/24  1441     Patient presents with: Dysuria and Hematuria   Joann Fleming is a 28 y.o. female with past medical history of hypothyroidism, BMI 35, HLD presents emergency department for evaluation of dysuria, hematuria, suprapubic abdominal pain that started this morning.  Endorses that she notices hematuria with urination and with wiping. LMP 10/12/24. Denies history of kidney stones, back pain, fevers, NVD, vaginal complaints.    Dysuria Hematuria       Prior to Admission medications  Medication Sig Start Date End Date Taking? Authorizing Provider  cephALEXin (KEFLEX) 500 MG capsule Take 1 capsule (500 mg total) by mouth 2 (two) times daily for 7 days. 10/20/24 10/27/24 Yes Minnie Tinnie BRAVO, PA  calcium carbonate (TUMS - DOSED IN MG ELEMENTAL CALCIUM) 500 MG chewable tablet Chew 1 tablet by mouth as needed for indigestion or heartburn.    [provider]  ferrous sulfate  325 (65 FE) MG tablet Take 1 tablet (325 mg total) by mouth every other day. 02/26/24   Zina Jerilynn LABOR, MD  fexofenadine (ALLEGRA) 180 MG tablet Take 180 mg by mouth as needed for allergies or rhinitis.    [provider]  levothyroxine  (SYNTHROID ) 125 MCG tablet Take 1 tablet (125 mcg total) by mouth daily. 04/21/24   Shamleffer, Donell Cardinal, MD  metFORMIN  (GLUCOPHAGE -XR) 500 MG 24 hr tablet Take 1 tablet (500 mg total) by mouth daily with breakfast. 10/12/24   Francyne Romano, MD  Phentermine  HCl (LOMAIRA ) 8 MG TABS Take 1 tablet (8 mg total) by mouth daily. 10/12/24   Francyne Romano, MD  Vitamin D , Ergocalciferol , (DRISDOL ) 1.25 MG (50000 UNIT) CAPS capsule Take 1 capsule (50,000 Units total) by mouth every 7 (seven) days. 04/21/24   Francyne Romano, MD    Allergies: Patient has no known allergies.    Review of Systems  Genitourinary:  Positive for dysuria and  hematuria.    Updated Vital Signs BP 111/78 (BP Location: Left Arm)   Pulse 81   Temp 97.8 F (36.6 C)   Resp 18   Ht 5' 3 (1.6 m)   Wt 89.8 kg   LMP 10/12/2024 (Approximate)   SpO2 100%   BMI 35.07 kg/m   Physical Exam Vitals and nursing note reviewed.  Constitutional:      General: She is not in acute distress.    Appearance: Normal appearance.  HENT:     Head: Normocephalic and atraumatic.  Eyes:     Conjunctiva/sclera: Conjunctivae normal.  Cardiovascular:     Rate and Rhythm: Normal rate.  Pulmonary:     Effort: Pulmonary effort is normal. No respiratory distress.     Breath sounds: Normal breath sounds.  Abdominal:     General: Bowel sounds are normal. There is no distension.     Palpations: Abdomen is soft.     Tenderness: There is abdominal tenderness in the suprapubic area. There is no right CVA tenderness, left CVA tenderness, guarding or rebound.     Comments: Nonsurgical abdomen with no peritoneal signs  Skin:    Coloration: Skin is not jaundiced or pale.  Neurological:     Mental Status: She is alert. Mental status is at baseline.     (all labs ordered are listed, but only abnormal results are displayed) Labs Reviewed  CBC WITH DIFFERENTIAL/PLATELET - Abnormal; Notable for the following components:      Result  Value   WBC 12.3 (*)    RBC 3.84 (*)    Hemoglobin 10.5 (*)    HCT 34.2 (*)    Platelets 443 (*)    Neutro Abs 9.1 (*)    All other components within normal limits  COMPREHENSIVE METABOLIC PANEL WITH GFR - Abnormal; Notable for the following components:   Total Protein 8.2 (*)    All other components within normal limits  URINALYSIS, ROUTINE W REFLEX MICROSCOPIC - Abnormal; Notable for the following components:   APPearance CLOUDY (*)    Hgb urine dipstick LARGE (*)    Protein, ur 100 (*)    Leukocytes,Ua LARGE (*)    Bacteria, UA RARE (*)    All other components within normal limits  LIPASE, BLOOD  POC URINE PREG, ED     EKG: None  Radiology: CT ABDOMEN PELVIS W CONTRAST Result Date: 10/20/2024 EXAM: CT ABDOMEN AND PELVIS WITH CONTRAST 10/20/2024 06:04:30 PM TECHNIQUE: CT of the abdomen and pelvis was performed with the administration of 100 mL of iohexol  (OMNIPAQUE ) 350 MG/ML injection. Multiplanar reformatted images are provided for review. Automated exposure control, iterative reconstruction, and/or weight-based adjustment of the mA/kV was utilized to reduce the radiation dose to as low as reasonably achievable. COMPARISON: None available. CLINICAL HISTORY: Abdominal pain, acute, nonlocalized; Hematuria, dysuria, difficulty urinating. Lower abdominal pain. FINDINGS: LOWER CHEST: No acute abnormality. LIVER: Mild hepatic steatosis. GALLBLADDER AND BILE DUCTS: Gallbladder is unremarkable. No biliary ductal dilatation. SPLEEN: No acute abnormality. PANCREAS: No acute abnormality. ADRENAL GLANDS: No acute abnormality. KIDNEYS, URETERS AND BLADDER: The bladder is largely decompressed. However, there is circumferential bladder wall thickening and hyperemia suggesting a superimposed infectious or inflammatory cystitis. Macro urinalysis. Correlation with urinalysis and urine culture may be helpful for further management. No stones in the kidneys or ureters. No hydronephrosis. No perinephric or periureteral stranding. GI AND BOWEL: Appendix normal. The stomach, small bowel, and large bowel are otherwise unremarkable. There is no bowel obstruction. PERITONEUM AND RETROPERITONEUM: No ascites. No free air. VASCULATURE: Aorta is normal in caliber. LYMPH NODES: No lymphadenopathy. REPRODUCTIVE ORGANS: Possible right hydrosalpinx, recommend dedicated non-emergent pelvic sonography for further evaluation. 5 cm simple-appearing cyst within the right adnexa. According to Ovarian-Adnexal Reporting and Data System Ultrasound (O-RADS US ), the finding is consistent with O-RADS US  2 (almost certainly benign) and the recommendation is: for a  simple cyst 3-5 cm in a premenopausal patient, no follow-up is needed; for a postmenopausal patient, a 1-year follow-up is recommended. For hydrosalpinx, specific recommendations may apply, but it is also classified as O-RADS US  2 (<10 cm). BONES AND SOFT TISSUES: No acute osseous abnormality. No focal soft tissue abnormality. IMPRESSION: 1. Circumferential bladder wall thickening and hyperemia, suggestive of infectious or inflammatory cystitis; urinalysis and urine culture may be helpful for further management. 2. Possible right hydrosalpinx; recommend dedicated non-emergent pelvic sonography for further evaluation. 3. 5 cm simple-appearing cyst within the right adnexa, consistent with O-RADS US  2 (almost certainly benign), with no follow-up imaging recommended. 4. Mild hepatic steatosis. Electronically signed by: Dorethia Molt MD 10/20/2024 06:14 PM EST RP Workstation: [id number]    Medications Ordered in the ED  iohexol  (OMNIPAQUE ) 350 MG/ML injection 100 mL (100 mLs Intravenous Contrast Given 10/20/24 1804)  cefTRIAXone  (ROCEPHIN ) 1 g in sodium chloride  0.9 % 100 mL IVPB (0 g Intravenous Stopped 10/20/24 1958)  Medical Decision Making Risk Prescription drug management.   Patient presents to the ED for concern of dysuria, hematuria, frequency, this involves an extensive number of treatment options, and is a complaint that carries with it a high risk of complications and morbidity.  The differential diagnosis includes UTI, pyelonephritis, infected stone, kidney stone, IC   Co morbidities that complicate the patient evaluation  None   Additional history obtained:  Additional history obtained from Nursing   External records from outside source obtained and reviewed including triage RN note   Lab Tests:  I Ordered, and personally interpreted labs.  The pertinent results include:   hCG negative WBC 12.3 UA with large Hgb, large leuks, WBC, rare  bacteria   Imaging Studies ordered:  I ordered imaging studies including CT abd pelvis  I independently visualized and interpreted imaging which showed  Circumferential bladder wall thickening and hyperemia, suggestive of infectious or inflammatory cystitis; urinalysis and urine culture may be helpful for further management. Possible right hydrosalpinx; recommend dedicated non-emergent pelvic sonography for further evaluation. 5 cm simple-appearing cyst within the right adnexa, consistent with O-RADS US  2 (almost certainly benign), with no follow-up imaging recommended. Mild hepatic steatosis I agree with the radiologist interpretation     Medicines ordered and prescription drug management:  I ordered medication including rocephin , keflex  for UTI  Reevaluation of the patient after these medicines showed that the patient stayed the same I have reviewed the patients home medicines and have made adjustments as needed    Problem List / ED Course:  UTI Vital signs hemodynamically stable no fever no tachycardia CT notable for concern for cystitis.  UA shows large Hgb, large leukocytes, WBC with rare bacteria No concern for pyelonephritis with no CVA tenderness nor pyelonephritis noted on CT imaging No stones Will provide Rocephin  here Emergency Department for UTI Also provided Keflex prescription for UTI. Discussed abx stewardship   Reevaluation:  After the interventions noted above, I reevaluated the patient and found that they have :improved     Dispostion:  After consideration of the diagnostic results and the patients response to treatment, I feel that the patent would benefit from outpatient management with Keflex prescription.   Discussed ED workup, disposition, return to ED precautions with patient who expresses understanding agrees with plan.  All questions answered to their satisfaction.  They are agreeable to plan.  Discharge instructions provided on  paperwork  Final diagnoses:  Urinary tract infection with hematuria, site unspecified    ED Discharge Orders          Ordered    cephALEXin (KEFLEX) 500 MG capsule  2 times daily        10/20/24 1848             Minnie Tinnie BRAVO, PA 10/20/24 2254

## 2024-10-20 NOTE — Discharge Instructions (Signed)
 Thank you for letting us  evaluate you today.  Your urine was infected.  I given you a dose of antibiotics here Emergency Department.  You may pick up your antibiotic and start taking it tomorrow.  Take as prescribed and do not miss a dose.  You should not have any leftover.  Your CT imaging showed right possible hydrosalpinx which can indicate fluid in the fallopian tube as well as a cyst in the right side.  Please follow-up with your OB/GYN for further management, possible ultrasound for this  Return to Emergency Department for experience intractable vomiting, worsening symptoms

## 2024-10-20 NOTE — ED Notes (Signed)
 POC Pregnancy test negative. KIT

## 2024-10-20 NOTE — ED Provider Triage Note (Signed)
 Emergency Medicine Provider Triage Evaluation Note  Joann Fleming , a 28 y.o. female  was evaluated in triage.  Pt complains of suprapubic abdominal pain associate with dysuria, hematuria and Difficulty urinating started this morning.  No vaginal discharge or bleeding.  No vomiting or diarrhea.  Review of Systems  Positive:  Negative:   Physical Exam  BP (!) 110/99 (BP Location: Right Arm)   Pulse 79   Temp 97.7 F (36.5 C) (Oral)   Resp 18   Ht 5' 3 (1.6 m)   Wt 89.8 kg   LMP 10/12/2024 (Approximate)   SpO2 100%   BMI 35.07 kg/m  Gen:   Awake, no distress   Resp:  Normal effort  MSK:   Moves extremities without difficulty  Other:  Suprapubic abdominal tenderness  Medical Decision Making  Medically screening exam initiated at 3:52 PM.  Appropriate orders placed.  Joann Fleming was informed that the remainder of the evaluation will be completed by another provider, this initial triage assessment does not replace that evaluation, and the importance of remaining in the ED until their evaluation is complete.     Donnajean Lynwood DEL, PA-C 10/20/24 1554

## 2024-11-09 ENCOUNTER — Ambulatory Visit (INDEPENDENT_AMBULATORY_CARE_PROVIDER_SITE_OTHER): Admitting: Internal Medicine

## 2024-11-13 ENCOUNTER — Other Ambulatory Visit (INDEPENDENT_AMBULATORY_CARE_PROVIDER_SITE_OTHER): Payer: Self-pay | Admitting: Internal Medicine

## 2024-11-13 DIAGNOSIS — E88819 Insulin resistance, unspecified: Secondary | ICD-10-CM

## 2024-11-13 DIAGNOSIS — Z6835 Body mass index (BMI) 35.0-35.9, adult: Secondary | ICD-10-CM

## 2024-11-25 ENCOUNTER — Other Ambulatory Visit (INDEPENDENT_AMBULATORY_CARE_PROVIDER_SITE_OTHER): Payer: Self-pay | Admitting: Internal Medicine

## 2024-11-25 ENCOUNTER — Encounter (INDEPENDENT_AMBULATORY_CARE_PROVIDER_SITE_OTHER): Payer: Self-pay | Admitting: Internal Medicine

## 2024-11-25 ENCOUNTER — Ambulatory Visit (INDEPENDENT_AMBULATORY_CARE_PROVIDER_SITE_OTHER): Admitting: Internal Medicine

## 2024-11-25 VITALS — BP 119/78 | HR 66 | Temp 97.6°F | Ht 63.0 in | Wt 198.0 lb

## 2024-11-25 DIAGNOSIS — E7800 Pure hypercholesterolemia, unspecified: Secondary | ICD-10-CM | POA: Diagnosis not present

## 2024-11-25 DIAGNOSIS — E88819 Insulin resistance, unspecified: Secondary | ICD-10-CM

## 2024-11-25 DIAGNOSIS — E66812 Obesity, class 2: Secondary | ICD-10-CM

## 2024-11-25 DIAGNOSIS — Z6835 Body mass index (BMI) 35.0-35.9, adult: Secondary | ICD-10-CM

## 2024-11-25 DIAGNOSIS — R638 Other symptoms and signs concerning food and fluid intake: Secondary | ICD-10-CM

## 2024-11-25 MED ORDER — WEGOVY 0.25 MG/0.5ML ~~LOC~~ SOAJ: 0.2500 mg | SUBCUTANEOUS | 0 refills | Status: DC

## 2024-11-25 NOTE — Progress Notes (Signed)
 "  Office: [phone]  /  Fax: [phone]  Weight Summary and Body Composition Analysis (BIA)  Vitals Temp: 97.6 F (36.4 C) BP: 119/78 Pulse Rate: 66 SpO2: 99 %   Anthropometric Measurements Height: 5' 3 (1.6 m) Weight: 198 lb (89.8 kg) BMI (Calculated): 35.08 Weight at Last Visit: 198 lb Weight Lost Since Last Visit: 0 lb Weight Gained Since Last Visit: 198 lb Starting Weight: 204 lb Total Weight Loss (lbs): 6 lb (2.722 kg) Peak Weight: 205 lb   Body Composition  Body Fat %: 41.2 % Fat Mass (lbs): 81.6 lbs Muscle Mass (lbs): 110.8 lbs Total Body Water (lbs): 76.2 lbs Visceral Fat Rating : 8    RMR: [id number]  Today's Visit #: 8  Starting Date: 04/07/24   Subjective   Chief Complaint: Obesity  Interval History  Discussed the use of AI scribe software for clinical note transcription with the patient, who gave verbal consent to proceed.  History of Present Illness Joann Fleming is a 29 year old female with insulin  resistance and high cholesterol who presents for medical weight management.  She has been maintaining her weight during the holidays and is currently exercising three to four days a week for thirty minutes, focusing mostly on cardio. She has started taking phentermine , but there have been issues with the medication being back-ordered.  She has a history of insulin  resistance and high cholesterol. She previously took Wegovy  for a month, which was initially covered by Medicaid. There was a period when Medicaid stopped covering it due to budgetary reasons, but coverage has resumed as the price of the medication has decreased.  She is currently on metformin , which she continues to take. She uses [employer]itnessPal to track her calorie intake, which ranges from 1,200 to 1,300 calories per day. Her jeans fit looser, and she reports that her clothes are fitting less tightly despite maintaining her weight.  She experiences nausea as a side effect of Wegovy . She  avoids greasy and sugary foods and stays hydrated to prevent constipation.     Challenges affecting patient progress: strong hunger signals and/or impaired satiety / inhibitory control and inadequate response to nutritional and behavioral strategies.    Pharmacotherapy for weight management: She is currently taking Metformin  (off label use for weight management and / or insulin  resistance and / or diabetes prevention) with adequate clinical response  and without side effects..   Assessment and Plan   Treatment Plan For Obesity:  Recommended Dietary Goals  Joann Fleming is currently in the action stage of change. As such, her goal is to continue weight management plan. She has agreed to: continue current reduced-calorie meal plan  Behavioral Health and Counseling  We discussed the following behavioral modification strategies today: continue to work on maintaining a reduced calorie state, getting the recommended amount of protein, incorporating whole foods, making healthy choices, staying well hydrated and practicing mindfulness when eating. and increase protein intake, fibrous foods (25 grams per day for women, 30 grams for men) and water to improve satiety and decrease hunger signals. .  Additional education and resources provided today: None  Recommended Physical Activity Goals  Joann Fleming has been advised to work up to 150 minutes of moderate intensity aerobic activity a week and strengthening exercises 2-3 times per week for cardiovascular health, weight loss maintenance and preservation of muscle mass.  She has agreed to :  Increase volume of physical activity to a goal of 240 minutes a week and Combine aerobic and strengthening exercises for efficiency  and improved cardiometabolic health.  Medical Interventions and Pharmacotherapy  We discussed various medication options to help Joann Fleming with her weight loss efforts and we both agreed to : We discussed various medication options to help Joann Fleming  with her weight loss efforts and we both agreed to starting anti-obesity pharmacotherapy with an GLP-1 agonist. In addition to a prescribed reduced calorie nutrition plan (RCNP), behavioral strategies and physical activity, Joann Fleming would benefit from pharmacotherapy to assist with abnormal hunger signals, impaired satiety and cravings. This will reduce obesity-related health risks by inducing weight loss, and help reduce food consumption and adherence to [facility]). It may also improve QOL by improving self-confidence and reduce the setbacks associated with metabolic adaptations.  she also has high risk comorbidities associated with her obesity including: Hyperlipidemia and Insulin  Resistance. All of these conditions increase her risk for future complications and are either improved or potentially reverse through sustained weight loss.  GLP-1 receptor agonists have been shown in robust clinical trials to: Enhance satiety and delay gastric emptying, resulting in reduced caloric intake Improve insulin  sensitivity and glycemic control Promote clinically meaningful weight loss (>=5-22%) Reduce cardiovascular risk markers, including blood pressure, lipids, and inflammation Improved sleep apnea and associated complications by helping patient potentially lose more than 15% of total body weight.  This is usually what is required to reduce AHI.  Given Joann Fleming's clinical profile--GLP-1 therapy is both indicated and expected to provide multifactorial benefit. The medication's mechanism aligns precisely with the patient's needs and addresses the physiologic underpinnings of weight gain.   After a detailed discussion covering treatment rationale, mechanism of action, common side effects, expected outcomes, risks, and long-term use considerations, shared decision-making was used to initiate Wegovy  0.25 mg once a week. The importance of ongoing lifestyle management and long-term adherence was emphasized, as was the potential  for weight regain following discontinuation.  The delivery device was demonstrated, and using the teach-back method, Joann Fleming successfully demonstrated proper injection technique. Ongoing monitoring and follow-up are planned to assess tolerability, clinical response, and reinforce behavioral strategies.   Associated Conditions Impacted by Obesity Treatment  Assessment & Plan Abnormal food appetite She has increased orexigenic signaling, impaired satiety and inhibitory control. 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  In addition to nutritional and behavioral strategies she benefits from pharmacotherapy.   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 Optimal level < 1.9.  She also has acanthosis nigricans on the neck.  This is complex condition associated with genetics, ectopic fat and lifestyle factors. Insulin  resistance may result in increased fat storage, inhibition of the breakdown of fat, cause fluctuations in blood sugar leading to energy crashes and increased cravings for sugary or high carb foods and cause metabolic slowdown making it difficult to lose weight.  This may result in additional weight gain and lead to pre-diabetes and diabetes if untreated. In addition, hyperinsulinemia increases cardiovascular risk, chronic inflammatory response and may increase the risk of obesity related malignancies.  Lab Results  Component Value Date   HGBA1C 5.2 04/07/2024   Lab Results  Component Value Date   INSULIN  12.7 04/07/2024   Lab Results  Component Value Date   GLUCOSE 91 10/20/2024   GLUCOSE 78 02/18/2023    Continue metformin  for pharmacoprevention.  Start Wegovy  0.25 mg once a week Pure hypercholesterolemia She continues to work on reducing saturated fats in her diet. Class 2 severe obesity with serious comorbidity and body mass index (BMI)  of 35.0 to 35.9 in adult,  unspecified obesity type Class 2 obesity with insulin  resistance and hypercholesterolemia Class 2 obesity with a BMI of 35, insulin  resistance, and hypercholesterolemia. She has maintained her weight during the holidays and exercises 3-4 days a week, primarily cardio. Previously on phentermine , but due to backorder, switching to Wegovy , which is now covered by Medicaid. Wegovy  is expected to aid in weight loss, particularly at higher doses (1.7 mg and 2.4 mg). Potential side effects include nausea, stomach upset, heartburn, diarrhea, and constipation. Emphasis on dietary modifications to avoid greasy, sugary, and fried foods, and maintaining hydration and fiber intake to prevent constipation. Metformin  will continue but may be discontinued once a high dose of Wegovy  is reached. - Switched from phentermine  to Wegovy . - Instructed on Wegovy  injection technique: wait 10 seconds after injection and rotate site weekly. - Adjust Wegovy  dose monthly from 0.25 mg to 2.4 mg. - Continue metformin , with potential discontinuation at high Wegovy  doses. -Patient counseled on nutritional strategies to reduce the risk of complications. - Set calorie target at 1200 calories per day for weight loss, with flexibility on weekends. - Will schedule follow-up in 4 weeks.          Objective   Physical Exam:  Blood pressure 119/78, pulse 66, temperature 97.6 F (36.4 C), height 5' 3 (1.6 m), weight 198 lb (89.8 kg), last menstrual period 04/03/2024, SpO2 99%, unknown if currently breastfeeding. Body mass index is 35.07 kg/m.  General: She is overweight, cooperative, alert, well developed, and in no acute distress. PSYCH: Has normal mood, affect and thought process.   HEENT: EOMI, sclerae are anicteric. Lungs: Normal breathing effort, no conversational dyspnea. Extremities: No edema.  Neurologic: No gross sensory or motor deficits. No tremors or fasciculations noted.    Diagnostic Data Reviewed:  BMET     Component Value Date/Time   NA 137 10/20/2024 1618   NA 140 04/07/2024 0904   K 4.0 10/20/2024 1618   CL 103 10/20/2024 1618   CO2 24 10/20/2024 1618   GLUCOSE 91 10/20/2024 1618   BUN 13 10/20/2024 1618   BUN 10 04/07/2024 0904   CREATININE 0.58 10/20/2024 1618   CALCIUM 9.5 10/20/2024 1618   GFRNONAA >60 10/20/2024 1618   Lab Results  Component Value Date   HGBA1C 5.2 04/07/2024   HGBA1C 5.5 01/03/2023   Lab Results  Component Value Date   INSULIN  12.7 04/07/2024   Lab Results  Component Value Date   TSH 9.69 (H) 12/22/2023   CBC    Component Value Date/Time   WBC 12.3 (H) 10/20/2024 1618   RBC 3.84 (L) 10/20/2024 1618   HGB 10.5 (L) 10/20/2024 1618   HGB 12.7 03/20/2023 1435   HCT 34.2 (L) 10/20/2024 1618   HCT 37.8 03/20/2023 1435   PLT 443 (H) 10/20/2024 1618   PLT 201 03/20/2023 1435   MCV 89.1 10/20/2024 1618   MCV 89 03/20/2023 1435   MCH 27.3 10/20/2024 1618   MCHC 30.7 10/20/2024 1618   RDW 13.2 10/20/2024 1618   RDW 16.5 (H) 03/20/2023 1435   Iron  Studies No results found for: IRON , TIBC, FERRITIN, IRONPCTSAT Lipid Panel     Component Value Date/Time   CHOL 153 04/07/2024 0904   TRIG 91 04/07/2024 0904   HDL 37 (L) 04/07/2024 0904   LDLCALC 99 04/07/2024 0904   Hepatic Function Panel     Component Value Date/Time   PROT 8.2 (H) 10/20/2024 1618   PROT 7.4 04/07/2024 0904  ALBUMIN  4.6 10/20/2024 1618   ALBUMIN  4.4 04/07/2024 0904   AST 26 10/20/2024 1618   ALT 19 10/20/2024 1618   ALKPHOS 102 10/20/2024 1618   BILITOT 0.5 10/20/2024 1618   BILITOT 0.3 04/07/2024 0904      Component Value Date/Time   TSH 9.69 (H) 12/22/2023 0802   Nutritional Lab Results  Component Value Date   VD25OH 11.2 (L) 04/07/2024    Medications: Outpatient Encounter Medications as of 11/25/2024  Medication Sig   calcium carbonate (TUMS - DOSED IN MG ELEMENTAL CALCIUM) 500 MG chewable tablet Chew 1 tablet by mouth as needed for indigestion or  heartburn.   ferrous sulfate  325 (65 FE) MG tablet Take 1 tablet (325 mg total) by mouth every other day.   fexofenadine (ALLEGRA) 180 MG tablet Take 180 mg by mouth as needed for allergies or rhinitis.   levothyroxine  (SYNTHROID ) 125 MCG tablet Take 1 tablet (125 mcg total) by mouth daily.   metFORMIN  (GLUCOPHAGE -XR) 500 MG 24 hr tablet TAKE 1 TABLET BY MOUTH EVERY DAY WITH BREAKFAST   Phentermine  HCl (LOMAIRA ) 8 MG TABS Take 1 tablet (8 mg total) by mouth daily.   semaglutide -weight management (WEGOVY ) 0.25 MG/0.5ML SOAJ SQ injection Inject 0.25 mg into the skin once a week.   Vitamin D , Ergocalciferol , (DRISDOL ) 1.25 MG (50000 UNIT) CAPS capsule Take 1 capsule (50,000 Units total) by mouth every 7 (seven) days.   No facility-administered encounter medications on file as of 11/25/2024.     Follow-Up   Return in about 4 weeks (around 12/23/2024) for For Weight Mangement with Dr. Francyne.SABRA She was informed of the importance of frequent follow up visits to maximize her success with intensive lifestyle modifications for her multiple health conditions.  Attestation Statement   Reviewed by clinician on day of visit: allergies, medications, problem list, medical history, surgical history, family history, social history, and previous encounter notes.     Lucas Francyne, MD  "

## 2024-11-25 NOTE — Assessment & Plan Note (Signed)
She has increased orexigenic signaling, impaired satiety and inhibitory control. 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  In addition to nutritional and behavioral strategies she benefits from pharmacotherapy.

## 2024-11-25 NOTE — Assessment & Plan Note (Signed)
 She continues to work on reducing saturated fats in her diet.

## 2024-11-25 NOTE — Assessment & Plan Note (Signed)
 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 Optimal level < 1.9.  She also has acanthosis nigricans on the neck.  This is complex condition associated with genetics, ectopic fat and lifestyle factors. Insulin  resistance may result in increased fat storage, inhibition of the breakdown of fat, cause fluctuations in blood sugar leading to energy crashes and increased cravings for sugary or high carb foods and cause metabolic slowdown making it difficult to lose weight.  This may result in additional weight gain and lead to pre-diabetes and diabetes if untreated. In addition, hyperinsulinemia increases cardiovascular risk, chronic inflammatory response and may increase the risk of obesity related malignancies.  Lab Results  Component Value Date   HGBA1C 5.2 04/07/2024   Lab Results  Component Value Date   INSULIN  12.7 04/07/2024   Lab Results  Component Value Date   GLUCOSE 91 10/20/2024   GLUCOSE 78 02/18/2023    Continue metformin  for pharmacoprevention.  Start Wegovy  0.25 mg once a week

## 2024-12-01 ENCOUNTER — Telehealth (INDEPENDENT_AMBULATORY_CARE_PROVIDER_SITE_OTHER): Payer: Self-pay | Admitting: Internal Medicine

## 2024-12-01 ENCOUNTER — Telehealth (INDEPENDENT_AMBULATORY_CARE_PROVIDER_SITE_OTHER): Payer: Self-pay

## 2024-12-01 NOTE — Telephone Encounter (Signed)
" °  Called to ask for an update about her PA for Wegovy  .25 she says the pharmacy is waiting on a response.   Pharmacy is the [facility] in [place].  "

## 2024-12-01 NOTE — Telephone Encounter (Signed)
 Wegovy  0.25 mg started

## 2024-12-01 NOTE — Telephone Encounter (Signed)
 Sent pt msg PA sent and waiting for response

## 2024-12-02 ENCOUNTER — Other Ambulatory Visit (INDEPENDENT_AMBULATORY_CARE_PROVIDER_SITE_OTHER): Payer: Self-pay

## 2024-12-02 ENCOUNTER — Other Ambulatory Visit (HOSPITAL_COMMUNITY): Payer: Self-pay

## 2024-12-02 DIAGNOSIS — E7800 Pure hypercholesterolemia, unspecified: Secondary | ICD-10-CM

## 2024-12-02 DIAGNOSIS — E88819 Insulin resistance, unspecified: Secondary | ICD-10-CM

## 2024-12-02 DIAGNOSIS — R638 Other symptoms and signs concerning food and fluid intake: Secondary | ICD-10-CM

## 2024-12-02 DIAGNOSIS — Z6835 Body mass index (BMI) 35.0-35.9, adult: Secondary | ICD-10-CM

## 2024-12-02 MED ORDER — WEGOVY 0.25 MG/0.5ML ~~LOC~~ SOAJ
0.2500 mg | SUBCUTANEOUS | 0 refills | Status: AC
  Filled 2024-12-02: qty 2, 28d supply, fill #0

## 2024-12-07 ENCOUNTER — Other Ambulatory Visit (HOSPITAL_COMMUNITY): Payer: Self-pay

## 2024-12-08 ENCOUNTER — Other Ambulatory Visit: Payer: Self-pay

## 2024-12-08 ENCOUNTER — Other Ambulatory Visit (HOSPITAL_COMMUNITY): Payer: Self-pay

## 2024-12-08 ENCOUNTER — Other Ambulatory Visit (HOSPITAL_BASED_OUTPATIENT_CLINIC_OR_DEPARTMENT_OTHER): Payer: Self-pay

## 2024-12-08 NOTE — Telephone Encounter (Signed)
 Received through Cover My Meds: The requested medication has been approved under [id number]. Based on the information reviewed, the requested prescription is currently authorized for coverage by the plan until 2025-06-07. Please resubmit this request within 30 days of authorization expiration date

## 2024-12-23 ENCOUNTER — Ambulatory Visit (INDEPENDENT_AMBULATORY_CARE_PROVIDER_SITE_OTHER): Admitting: Internal Medicine
